# Patient Record
Sex: Male | Born: 1970
Health system: Southern US, Community
[De-identification: ages and names within clinical notes are randomized; demographics above are authoritative.]

## PROBLEM LIST (undated history)

## (undated) DIAGNOSIS — E119 Type 2 diabetes mellitus without complications: Secondary | ICD-10-CM

## (undated) DIAGNOSIS — E7800 Pure hypercholesterolemia, unspecified: Secondary | ICD-10-CM

## (undated) DIAGNOSIS — I10 Essential (primary) hypertension: Secondary | ICD-10-CM

## (undated) HISTORY — PX: BICEPS TENDON REPAIR: SHX566

---

## 1999-09-04 ENCOUNTER — Encounter: Payer: Self-pay | Admitting: Emergency Medicine

## 1999-09-04 ENCOUNTER — Emergency Department (HOSPITAL_COMMUNITY): Admission: EM | Admit: 1999-09-04 | Discharge: 1999-09-04 | Payer: Self-pay | Admitting: Emergency Medicine

## 2000-03-02 ENCOUNTER — Encounter: Payer: Self-pay | Admitting: Emergency Medicine

## 2000-03-02 ENCOUNTER — Emergency Department (HOSPITAL_COMMUNITY): Admission: EM | Admit: 2000-03-02 | Discharge: 2000-03-02 | Payer: Self-pay | Admitting: Emergency Medicine

## 2003-06-09 ENCOUNTER — Encounter: Payer: Self-pay | Admitting: Emergency Medicine

## 2003-06-09 ENCOUNTER — Emergency Department (HOSPITAL_COMMUNITY): Admission: EM | Admit: 2003-06-09 | Discharge: 2003-06-09 | Payer: Self-pay | Admitting: Emergency Medicine

## 2009-08-20 ENCOUNTER — Emergency Department (HOSPITAL_COMMUNITY): Admission: EM | Admit: 2009-08-20 | Discharge: 2009-08-20 | Payer: Self-pay | Admitting: Emergency Medicine

## 2010-04-06 ENCOUNTER — Emergency Department (HOSPITAL_COMMUNITY): Admission: EM | Admit: 2010-04-06 | Discharge: 2010-04-06 | Payer: Self-pay | Admitting: Family Medicine

## 2011-07-11 ENCOUNTER — Ambulatory Visit (INDEPENDENT_AMBULATORY_CARE_PROVIDER_SITE_OTHER): Payer: Self-pay

## 2011-07-11 ENCOUNTER — Inpatient Hospital Stay (INDEPENDENT_AMBULATORY_CARE_PROVIDER_SITE_OTHER)
Admission: RE | Admit: 2011-07-11 | Discharge: 2011-07-11 | Disposition: A | Discharge status: Home / Self Care | Payer: Self-pay | Source: Ambulatory Visit | Attending: Emergency Medicine | Admitting: Emergency Medicine

## 2011-07-11 DIAGNOSIS — M542 Cervicalgia: Secondary | ICD-10-CM

## 2011-07-11 DIAGNOSIS — S0993XA Unspecified injury of face, initial encounter: Secondary | ICD-10-CM

## 2011-07-11 DIAGNOSIS — S199XXA Unspecified injury of neck, initial encounter: Secondary | ICD-10-CM

## 2012-10-28 IMAGING — CR DG CERVICAL SPINE COMPLETE 4+V
6 series · 6 of 6 positions shown · non-contrast
Comparison: None.

CLINICAL DATA: Chronic neck pain, recently worsened.  Pain radiates
into the right shoulder and right upper extremity, with
paresthesias.

CERVICAL SPINE - COMPLETE 4+ VIEW 07/11/2011:

[view not recorded (1 of 6)]
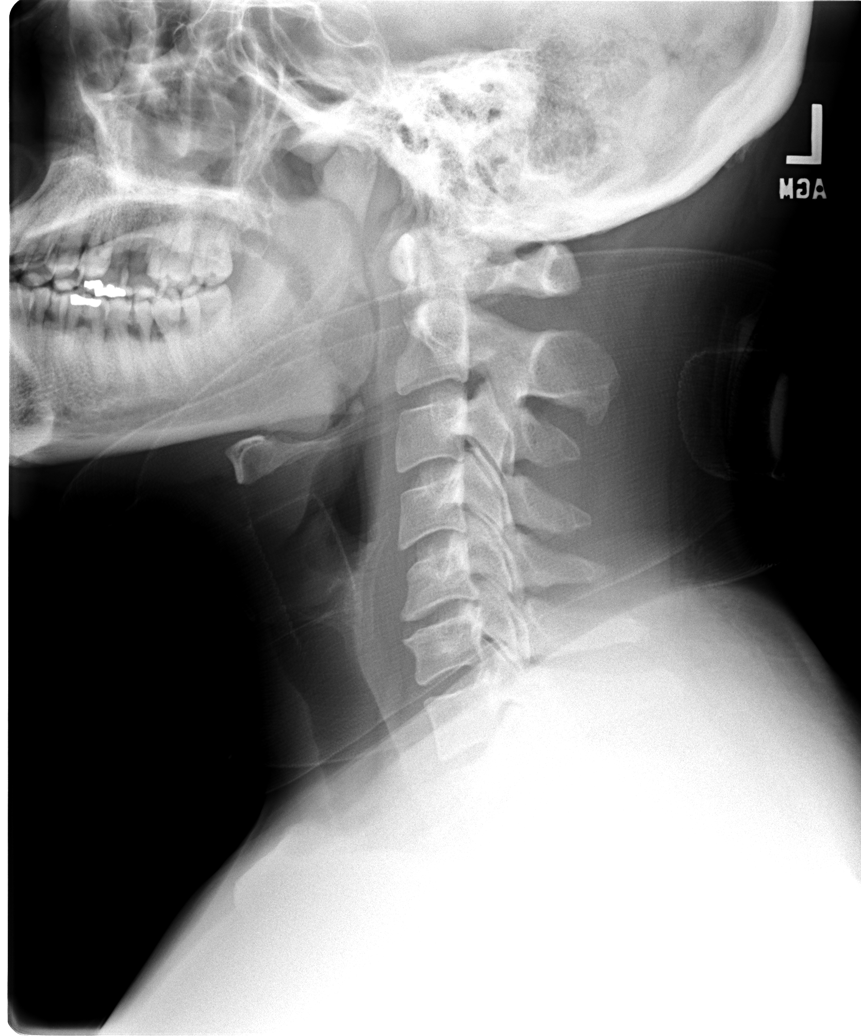

[view not recorded (2 of 6)]
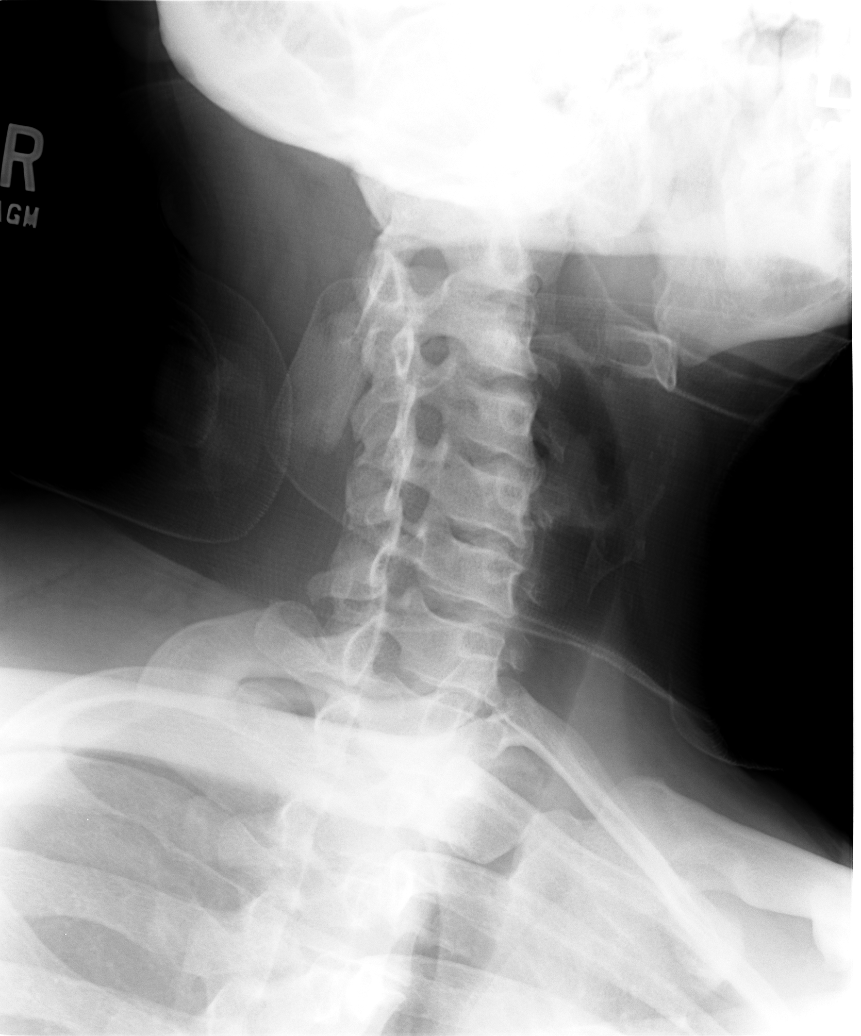

[view not recorded (3 of 6)]
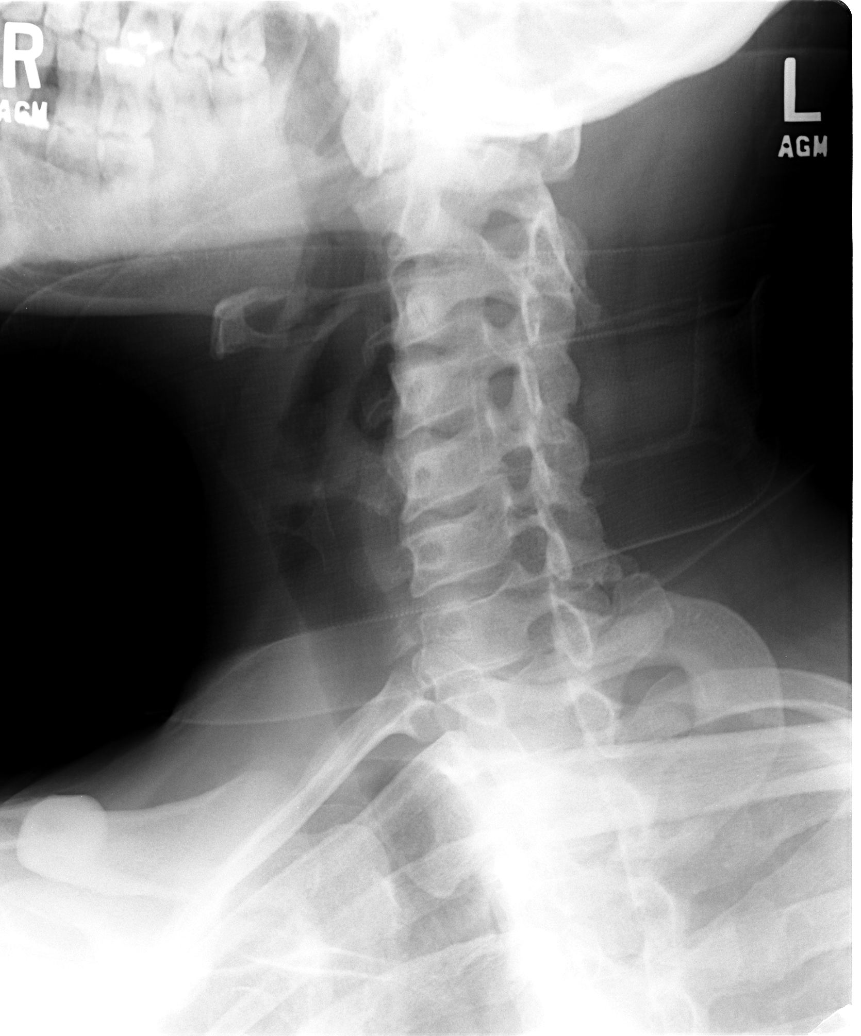

[view not recorded (4 of 6)]
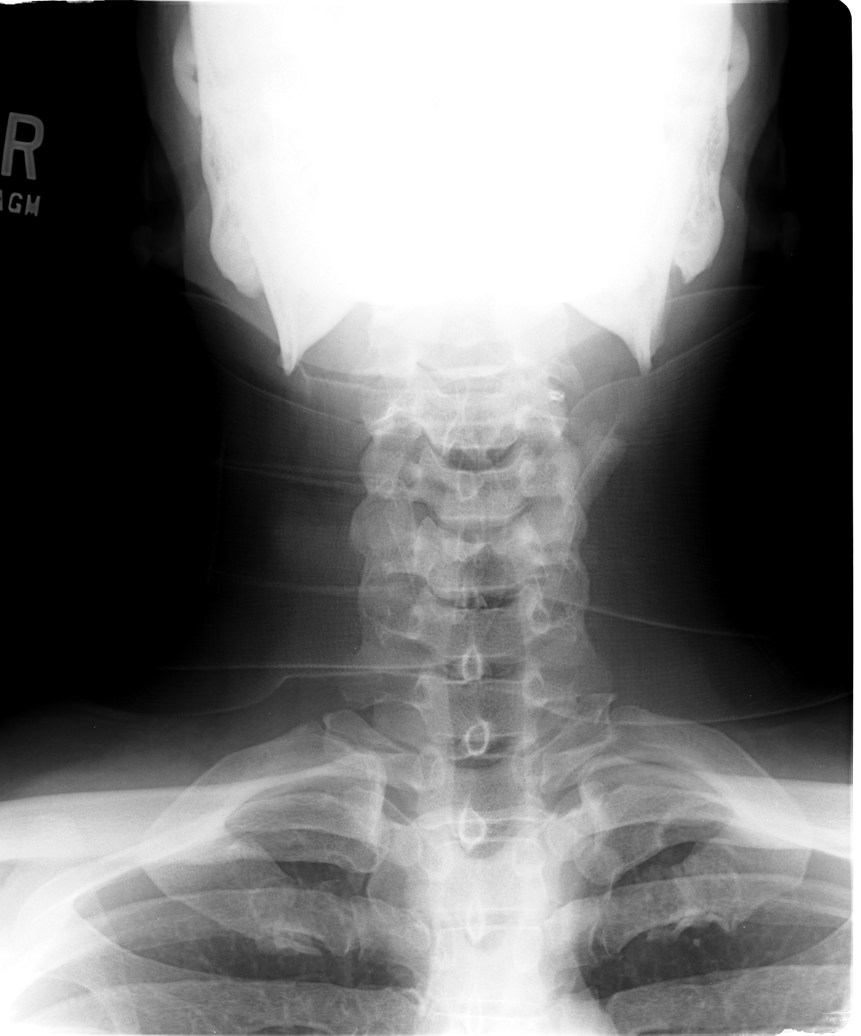

[view not recorded (5 of 6)]
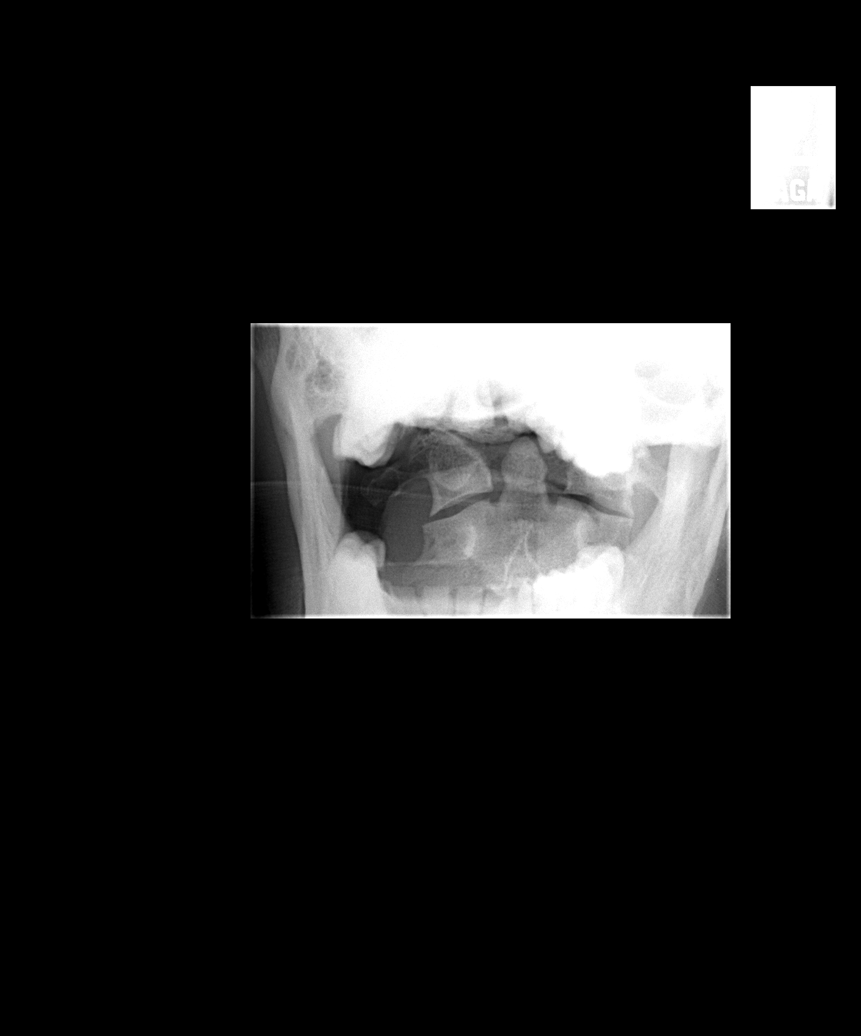

[view not recorded (6 of 6)]
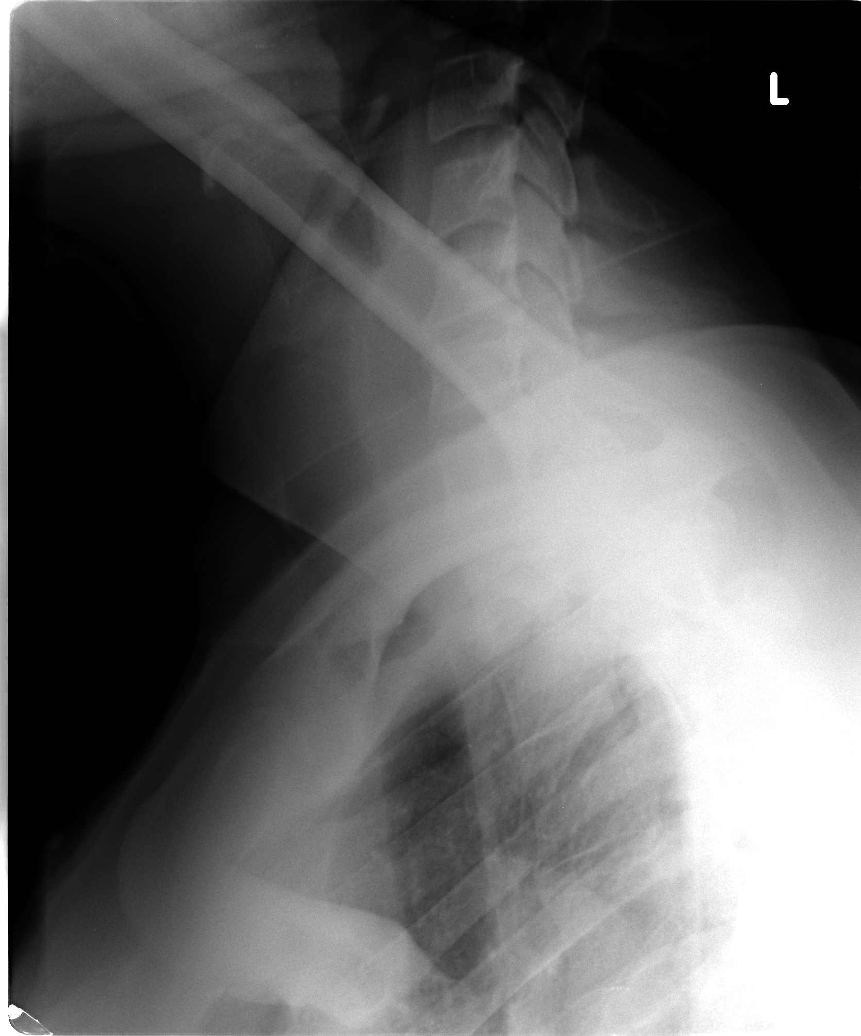

[6 of 6 positions shown; findings below may reference images not displayed]

FINDINGS: Examination was performed with the patient in a soft
cervical collar.  Slight straightening of the usual cervical
lordosis.  Anatomic posterior alignment.  No fractures.  Normal
prevertebral soft tissues.  Mild disc space narrowing and endplate
hypertrophic changes at C5-6.  Remaining disc spaces well
preserved.  No significant bony foraminal stenoses.  Facet joints
intact.  No static evidence of instability.
IMPRESSION: Slight straightening of the usual lordosis which may reflect
positioning and/or spasm.  Mild degenerative disc disease and
spondylosis at C5-6.

## 2021-02-06 ENCOUNTER — Other Ambulatory Visit: Payer: Self-pay

## 2021-02-06 ENCOUNTER — Encounter (HOSPITAL_COMMUNITY): Payer: Self-pay | Admitting: Emergency Medicine

## 2021-02-06 ENCOUNTER — Emergency Department (HOSPITAL_COMMUNITY)
Admission: EM | Admit: 2021-02-06 | Discharge: 2021-02-06 | Disposition: A | Discharge status: Home / Self Care | Payer: Self-pay | Attending: Emergency Medicine | Admitting: Emergency Medicine

## 2021-02-06 DIAGNOSIS — M5441 Lumbago with sciatica, right side: Secondary | ICD-10-CM

## 2021-02-06 DIAGNOSIS — M5440 Lumbago with sciatica, unspecified side: Secondary | ICD-10-CM | POA: Insufficient documentation

## 2021-02-06 DIAGNOSIS — M5442 Lumbago with sciatica, left side: Secondary | ICD-10-CM

## 2021-02-06 MED ORDER — METHOCARBAMOL 500 MG PO TABS: 500.0000 mg | ORAL_TABLET | Freq: Three times a day (TID) | ORAL | 0 refills | Status: DC | PRN

## 2021-02-06 MED ORDER — KETOROLAC TROMETHAMINE 60 MG/2ML IM SOLN
60.0000 mg | Freq: Once | INTRAMUSCULAR | Status: AC
  Administered 2021-02-06: 60 mg via INTRAMUSCULAR
  Filled 2021-02-06: qty 2

## 2021-02-06 MED ORDER — PREDNISONE 20 MG PO TABS: 60.0000 mg | ORAL_TABLET | Freq: Every day | ORAL | 0 refills | Status: AC

## 2021-02-06 MED ORDER — OXYCODONE-ACETAMINOPHEN 5-325 MG PO TABS
1.0000 | ORAL_TABLET | Freq: Once | ORAL | Status: AC
  Administered 2021-02-06: 1 via ORAL
  Filled 2021-02-06: qty 1

## 2021-02-06 MED ORDER — LIDOCAINE 5 % EX PTCH: 1.0000 | MEDICATED_PATCH | CUTANEOUS | 0 refills | Status: DC

## 2021-02-06 MED ORDER — METHOCARBAMOL 500 MG PO TABS
500.0000 mg | ORAL_TABLET | Freq: Once | ORAL | Status: AC
  Administered 2021-02-06: 500 mg via ORAL
  Filled 2021-02-06: qty 1

## 2021-02-06 NOTE — ED Triage Notes (Signed)
Pt states he injured his lower back yesterday when he was picking up backpack to get off bus.  Reports lower back pain that radiates down R leg.

## 2021-02-06 NOTE — Discharge Instructions (Signed)
You came to the emergency department today to be evaluated for your low back pain.  Your physical exam was reassuring.  Your symptoms are consistent with sciatica.  I have given you prescription for prednisone, lidocaine patch, and Robaxin.  Please use these medications as prescribed.  Follow-up with orthopedic for further management of your pain.  Today you received medications that may make you sleepy or impair your ability to make decisions.  For the next 24 hours please do not drive, operate heavy machinery, care for a small child with out another adult present, or perform any activities that may cause harm to you or someone else if you were to fall asleep or be impaired.   Today you were prescribed Methocarbamol (Robaxin).  Methocarbamol (Robaxin) is used to treat muscle spasms/pain.  It works by helping to relax the muscles.  Drowsiness, dizziness, lightheadedness, stomach upset, nausea/vomiting, or blurred vision may occur.  Do not drive, use machinery, or do anything that needs alertness or clear vision until you can do it safely.  Do not combine this medication with alcoholic beverages, marijuana, or other central nervous system depressants.     I have given you a prescription for steroids today.  Some common side effects include feelings of extra energy, feeling warm, increased appetite, and stomach upset.  If you are diabetic your sugars may run higher than usual.    Get help right away if: You are not able to control when you urinate or have bowel movements (incontinence). You have: Weakness in your lower back, pelvis, buttocks, or legs that gets worse. Redness or swelling of your back. A burning sensation when you urinate.

## 2021-02-06 NOTE — ED Provider Notes (Signed)
[name] EMERGENCY DEPARTMENT Provider Note   CSN: [id number] Arrival date & time: 02/06/21  1147     History Chief Complaint  Patient presents with  . Back Pain    Hunter Fisher is a 50 y.o. male reported history of sciatica.  Patient is with a chief complaint of bilateral low back pain.  Patient pain started yesterday after picking up a heavy bag.  Patient reports that pain has been constant, 10/10 on the pain scale, radiates into his legs.  It is worse when he is sitting and better when he is standing.    Reports that after picking up the bag yesterday he felt a sharp pain in his low back.  Patient reports that this pain is similar to pain he has had in the past related to his sciatica.  Patient reports that he recently moved from [place] and does not have any providers in the area.    Patient denies any numbness to extremities, weakness to extremities, saddle anesthesia, bowel or bladder dysfunction, fever, chills, neck pain.  Patient denies any traumatic falls or injuries.  Patient denies taking any blood thinners.  HPI     History reviewed. No pertinent past medical history.  There are no problems to display for this patient.   History reviewed. No pertinent surgical history.     No family history on file.  Social History   Tobacco Use  . Smoking status: Never Smoker  . Smokeless tobacco: Never Used  Substance Use Topics  . Alcohol use: Not Currently  . Drug use: Yes    Types: Marijuana    Home Medications Prior to Admission medications   Not on File    Allergies    Patient has no allergy information on record.  Review of Systems   Review of Systems  Constitutional: Negative for chills and fever.  Eyes: Negative for visual disturbance.  Respiratory: Negative for shortness of breath.   Cardiovascular: Negative for chest pain.  Gastrointestinal: Negative for nausea and vomiting.  Genitourinary: Negative for difficulty urinating and  dysuria.  Musculoskeletal: Positive for back pain. Negative for neck pain.  Skin: Negative for color change and rash.  Neurological: Negative for dizziness, syncope, weakness, light-headedness, numbness and headaches.  Psychiatric/Behavioral: Negative for confusion.    Physical Exam Updated Vital Signs BP 137/88 (BP Location: Left Arm)   Pulse 91   Temp 98.7 F (37.1 C)   Resp 16   SpO2 99%   Physical Exam Vitals and nursing note reviewed.  Constitutional:      General: He is not in acute distress.    Appearance: He is not ill-appearing, toxic-appearing or diaphoretic.  HENT:     Head: Normocephalic and atraumatic.  Eyes:     General: No scleral icterus.       Right eye: No discharge.        Left eye: No discharge.     Extraocular Movements: Extraocular movements intact.     Pupils: Pupils are equal, round, and reactive to light.  Cardiovascular:     Rate and Rhythm: Normal rate.  Pulmonary:     Effort: Pulmonary effort is normal.  Musculoskeletal:     Cervical back: Normal range of motion and neck supple. No deformity, rigidity, tenderness or bony tenderness.     Thoracic back: No deformity, tenderness or bony tenderness.     Lumbar back: Tenderness present. No deformity or bony tenderness.     Right lower leg: No deformity, tenderness  or bony tenderness. No edema.     Left lower leg: No deformity, tenderness or bony tenderness. No edema.     Comments: No erythema, bruising, warmth or swelling noted to patient's back  Skin:    General: Skin is warm and dry.  Neurological:     General: No focal deficit present.     Mental Status: He is alert.     GCS: GCS eye subscore is 4. GCS verbal subscore is 5. GCS motor subscore is 6.     Cranial Nerves: No cranial nerve deficit or facial asymmetry.     Sensory: Sensation is intact.     Motor: No weakness, tremor, seizure activity or pronator drift.     Coordination: Romberg sign negative. Finger-Nose-Finger Test normal.      Gait: Gait is intact.     Comments: CN II-XII intact, equal grip strength, +5 strength to bilateral upper and lower extremities   Antalgic gait  Psychiatric:        Behavior: Behavior is cooperative.     ED Results / Procedures / Treatments   Labs (all labs ordered are listed, but only abnormal results are displayed) Labs Reviewed - No data to display  EKG None  Radiology No results found.  Procedures Procedures   Medications Ordered in ED Medications  oxyCODONE-acetaminophen (PERCOCET/ROXICET) 5-325 MG per tablet 1 tablet (1 tablet Oral Given 02/06/21 1342)  ketorolac (TORADOL) injection 60 mg (60 mg Intramuscular Given 02/06/21 1343)  methocarbamol (ROBAXIN) tablet 500 mg (500 mg Oral Given 02/06/21 1343)    ED Course  I have reviewed the triage vital signs and the nursing notes.  Pertinent labs & imaging results that were available during my care of the patient were reviewed by me and considered in my medical decision making (see chart for details).    MDM Rules/Calculators/A&P                          Alert 50 year old male in no acute distress, uncomfortable appearing.  Patient is noted to be standing bent over with forearms resting on countertop.  Patient presents with chief complaint of low back pain radiating radiating into bilateral lower extremities.  Patient reports that his pain began yesterday after he picked up a heavy bag and felt a sharp pain in his lower back.  Patient reports history of sciatica and states pain similar to previous episodes.  Patient denies any weakness to extremities, numbness to extremities, saddle anesthesia, bowel or bladder dysfunction, neck pain.  Patient denies any falls or traumatic injuries.  Patient not on any blood thinners.  CN II through XII intact, +5 strength to bilateral upper and lower extremities, sensation intact, negative pronator drift, negative Romberg, negative abnormal nose to finger.  Patient was able to ambulate without  assistance was noted to have antalgic gait.  Tenderness to left and right lumbar back.  No tenderness to cervical, thoracic, or lumbar spine.  Patient's symptoms consistent with sciatica.  Lifting heavy bag without proper lifting technique likely causing muscle strain.  Patient was given Percocet, Toradol, and Robaxin.  Patient reports improvement in his symptoms.  Will prescribe patient Robaxin, lidocaine patch, and 5-day prednisone course.  Patient to follow-up with orthopedics.  Discussed results, findings, treatment and follow up. Patient advised of return precautions. Patient verbalized understanding and agreed with plan.   Final Clinical Impression(s) / ED Diagnoses Final diagnoses:  Acute bilateral low back pain with bilateral sciatica  Rx / DC Orders ED Discharge Orders         Ordered    predniSONE (DELTASONE) 20 MG tablet  Daily        02/06/21 1423    lidocaine (LIDODERM) 5 %  Every 24 hours        02/06/21 1423    methocarbamol (ROBAXIN) 500 MG tablet  Every 8 hours PRN        02/06/21 1423           Berneice Heinrich 02/06/21 2234    Derwood Kaplan, MD 02/07/21 [phone]

## 2021-02-06 NOTE — ED Notes (Signed)
Pt occupied on phone, will medicate once pt is available.

## 2021-02-13 ENCOUNTER — Encounter (HOSPITAL_COMMUNITY): Payer: Self-pay

## 2021-02-13 ENCOUNTER — Emergency Department (HOSPITAL_COMMUNITY)
Admission: EM | Admit: 2021-02-13 | Discharge: 2021-02-13 | Disposition: A | Discharge status: Home / Self Care | Payer: Self-pay | Attending: Emergency Medicine | Admitting: Emergency Medicine

## 2021-02-13 DIAGNOSIS — M5432 Sciatica, left side: Secondary | ICD-10-CM

## 2021-02-13 DIAGNOSIS — X501XXA Overexertion from prolonged static or awkward postures, initial encounter: Secondary | ICD-10-CM | POA: Insufficient documentation

## 2021-02-13 DIAGNOSIS — M7918 Myalgia, other site: Secondary | ICD-10-CM | POA: Insufficient documentation

## 2021-02-13 DIAGNOSIS — R202 Paresthesia of skin: Secondary | ICD-10-CM | POA: Insufficient documentation

## 2021-02-13 DIAGNOSIS — M5442 Lumbago with sciatica, left side: Secondary | ICD-10-CM | POA: Insufficient documentation

## 2021-02-13 MED ORDER — DEXAMETHASONE SODIUM PHOSPHATE 10 MG/ML IJ SOLN
10.0000 mg | Freq: Once | INTRAMUSCULAR | Status: AC
  Administered 2021-02-13: 10 mg via INTRAMUSCULAR
  Filled 2021-02-13: qty 1

## 2021-02-13 MED ORDER — METHOCARBAMOL 1000 MG/10ML IJ SOLN
1000.0000 mg | Freq: Once | INTRAMUSCULAR | Status: AC
  Administered 2021-02-13: 1000 mg via INTRAMUSCULAR
  Filled 2021-02-13: qty 10

## 2021-02-13 MED ORDER — METHOCARBAMOL 500 MG PO TABS: 500.0000 mg | ORAL_TABLET | Freq: Four times a day (QID) | ORAL | 0 refills | Status: DC | PRN

## 2021-02-13 NOTE — Discharge Instructions (Addendum)
1.  You were given a dose of Decadron in the emergency department.  This is a steroid shot.  It will be working and should be helpful for the next week.  You have been given a prescription for Robaxin.  This is a muscle relaxer.  You may take 1 tablet every 6 hours as needed for muscle spasms with pain. 2.  It is safe to take over-the-counter extra strength Tylenol every 6 hours along with your current medications.  This should be helpful for pain.  You may also use the Lidoderm patches that were previously prescribed. 3.  It is very important you get scheduled with a follow-up appointment to manage what is likely a chronic problem.  You should have a family physician.  A resource guide is included to help you find low-cost medical care.  Also you have been given information for [facility] [facility].  This is the clinic associated with the hospital.  Ultimately, you will likely need help getting physical therapy and referral to the spine specialist.

## 2021-02-13 NOTE — ED Triage Notes (Signed)
Pt reports that he hurt his back the other day at work, pain shoots down both legs, worse in the L leg, seen here the other day for the same and symptoms have not resolved.

## 2021-02-13 NOTE — ED Provider Notes (Signed)
[name] EMERGENCY DEPARTMENT Provider Note   CSN: [id number] Arrival date & time: 02/13/21  5397     History Chief Complaint  Patient presents with  . Back Pain    Hunter Fisher is a 50 y.o. male.  HPI Patient reports has had problems off and on with his lower back.  He has never had his surgery.  He did move to this area recently, within the past 3 weeks.  He reports he came from [place] and previously he was getting treated by a chiropractor intermittently.  He reports the chiropractor did some x-rays about 3 months ago.  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  He reports his lifting is not heavy but there is repetitive bending.  He was seen in the emergency department about a week ago and had symptoms of sciatica.  He was treated with a low-dose prednisone 20 mg daily and a Lidoderm patch and Robaxin.  He reports he was off work and was doing somewhat better.  He reports he went back to work today and with bending and lifting he started getting pain again and his employer wanted him to come and get rechecked.  Patient reports that the pain occurs in his very low back and buttocks.  It is greater in the left than the right.  He reports on the left to get some tingling and burning pain that goes down the back of the leg almost to the mid calf.  He reports on the right the pain will get down to behind his knee.  He reports it is very much exacerbated by bending forward.  He denies any feeling of weakness or numbness into the legs.  He reports he can walk without difficulty.  No difficulty urinating or having a bowel movement.  No associated abdominal pain.  Patient does not have any chronic medical problems that he is aware of.    History reviewed. No pertinent past medical history.  There are no problems to display for this patient.   History reviewed. No pertinent surgical history.     No family history on file.  Social History   Tobacco Use  .  Smoking status: Never Smoker  . Smokeless tobacco: Never Used  Substance Use Topics  . Alcohol use: Not Currently  . Drug use: Yes    Types: Marijuana    Home Medications Prior to Admission medications   Medication Sig Start Date End Date Taking? Authorizing Provider  methocarbamol (ROBAXIN) 500 MG tablet Take 1 tablet (500 mg total) by mouth every 6 (six) hours as needed for muscle spasms. 02/13/21  Yes Tinamarie Przybylski, Lebron Conners, MD  lidocaine (LIDODERM) 5 % Place 1 patch onto the skin daily. Remove & Discard patch within 12 hours or as directed by MD 02/06/21   Haskel Schroeder, PA-C  methocarbamol (ROBAXIN) 500 MG tablet Take 1 tablet (500 mg total) by mouth every 8 (eight) hours as needed for muscle spasms. 02/06/21   Haskel Schroeder, PA-C    Allergies    Patient has no known allergies.  Review of Systems   Review of Systems 10 systems reviewed and negative except as per HPI Physical Exam Updated Vital Signs BP (!) 159/109 (BP Location: Right Arm)   Pulse 96   Temp 98.4 F (36.9 C) (Oral)   Resp 18   SpO2 91%   Physical Exam Constitutional:      Appearance: Normal appearance.  HENT:     Mouth/Throat:  Pharynx: Oropharynx is clear.  Eyes:     Extraocular Movements: Extraocular movements intact.  Cardiovascular:     Rate and Rhythm: Normal rate.  Pulmonary:     Effort: Pulmonary effort is normal.     Breath sounds: Normal breath sounds.  Abdominal:     General: There is no distension.     Palpations: Abdomen is soft.  Musculoskeletal:        General: No swelling. Normal range of motion.     Comments: There is some reproducible tenderness to palpation of the sciatic nerve in the buttock.  Particularly in the left buttock.  No pain to palpation at the knee or calf.  Mild paraspinous muscle body tenderness of the lumbar spine.  No bony point tenderness of the lumbar spine.  Patient ambulates with a nonantalgic gait.  Neurological:     General: No focal deficit  present.     Mental Status: He is alert and oriented to person, place, and time.     Motor: No weakness.     Coordination: Coordination normal.     Gait: Gait normal.     Comments: Patient is easily ambulatory without difficulty.  Psychiatric:        Mood and Affect: Mood normal.     ED Results / Procedures / Treatments   Labs (all labs ordered are listed, but only abnormal results are displayed) Labs Reviewed - No data to display  EKG None  Radiology No results found.  Procedures Procedures   Medications Ordered in ED Medications  dexamethasone (DECADRON) injection 10 mg (has no administration in time range)  methocarbamol (ROBAXIN) injection 1,000 mg (has no administration in time range)    ED Course  I have reviewed the triage vital signs and the nursing notes.  Pertinent labs & imaging results that were available during my care of the patient were reviewed by me and considered in my medical decision making (see chart for details).    MDM Rules/Calculators/A&P                          Patient presents with persisting symptoms of sciatica.  This is been exacerbated again by going back to work and repetitive bending.  At this time will do a IM shot of Decadron and Robaxin in the emergency department.  Patient counseled to continue Robaxin, Tylenol and Lidoderm patches on outpatient basis.  Patient counseled that he will need follow-up with both a family doctor for basic medical management and referrals for physical therapy and orthopedics as needed.  Patient is also provided with contact information for neurosurgery spine specialist.  Patient counseled that the best management for his condition at this time will be getting established with outpatient providers for further diagnostic evaluation if needed and interventional treatment.  No emergent conditions present at this time.  Patient does not have any loss of sensation, no dysfunction of bowel or bladder, he is ambulatory  without difficulty. Final Clinical Impression(s) / ED Diagnoses Final diagnoses:  Sciatica of left side    Rx / DC Orders ED Discharge Orders         Ordered    methocarbamol (ROBAXIN) 500 MG tablet  Every 6 hours PRN        02/13/21 1009           Arby Barrette, MD 02/13/21 1020

## 2021-02-13 NOTE — ED Notes (Signed)
Pharmacy called for missing Robaxin dose.

## 2021-02-13 NOTE — ED Notes (Signed)
Patient verbalizes understanding of discharge instructions. Opportunity for questioning and answers were provided. Armband removed by staff, pt discharged from ED and ambulated to lobby to return home with family.  

## 2021-02-19 ENCOUNTER — Other Ambulatory Visit: Payer: Self-pay

## 2021-02-19 ENCOUNTER — Ambulatory Visit: Payer: Self-pay | Admitting: Nurse Practitioner

## 2021-02-28 ENCOUNTER — Other Ambulatory Visit: Payer: Self-pay | Admitting: Physician Assistant

## 2021-02-28 ENCOUNTER — Ambulatory Visit: Payer: Self-pay | Attending: Nurse Practitioner | Admitting: Physician Assistant

## 2021-02-28 ENCOUNTER — Other Ambulatory Visit: Payer: Self-pay

## 2021-02-28 DIAGNOSIS — Z09 Encounter for follow-up examination after completed treatment for conditions other than malignant neoplasm: Secondary | ICD-10-CM

## 2021-02-28 DIAGNOSIS — M5430 Sciatica, unspecified side: Secondary | ICD-10-CM

## 2021-02-28 MED ORDER — METHOCARBAMOL 500 MG PO TABS: 1000.0000 mg | ORAL_TABLET | Freq: Three times a day (TID) | ORAL | 0 refills | Status: DC | PRN

## 2021-02-28 MED ORDER — NAPROXEN 500 MG PO TABS: 500.0000 mg | ORAL_TABLET | Freq: Two times a day (BID) | ORAL | 1 refills | Status: DC

## 2021-02-28 NOTE — Progress Notes (Unsigned)
Patient ID: Hunter Fisher, male   DOB: 1971-12-03, 50 y.o.   MRN: [id number]   Virtual Visit via Telephone Note  I connected with Hunter Fisher on 02/28/21 at  3:50 PM EST by telephone and verified that I am speaking with the correct person using two identifiers.  Location: Patient: home Provider: [facility] office   I discussed the limitations, risks, security and privacy concerns of performing an evaluation and management service by telephone and the availability of in person appointments. I also discussed with the patient that there may be a patient responsible charge related to this service. The patient expressed understanding and agreed to proceed.   History of Present Illness: After being seen in the ED 02/13/2021.  Still having some pain.  He has not made f/up appt with [facility] yet and lost paper with their information on it.  Feels as tho he can't perform all of his job duties.   From ED A/P: Patient presents with persisting symptoms of sciatica.  This is been exacerbated again by going back to work and repetitive bending.  At this time will do a IM shot of Decadron and Robaxin in the emergency department.  Patient counseled to continue Robaxin, Tylenol and Lidoderm patches on outpatient basis.  Patient counseled that he will need follow-up with both a family doctor for basic medical management and referrals for physical therapy and orthopedics as needed.  Patient is also provided with contact information for neurosurgery spine specialist.  Patient counseled that the best management for his condition at this time will be getting established with outpatient providers for further diagnostic evaluation if needed and interventional treatment.  No emergent conditions present at this time.  Patient does not have any loss of sensation, no dysfunction of bowel or bladder, he is ambulatory without difficulty.    Observations/Objective:  NAD.  A&Ox3    Assessment and Plan:  1. Sciatica,  unspecified laterality Robaxin and naprosyn sent.  I gave him information(and he wrote it down and read it back to me) for [facility] as he was supposed to f/up there and has not made appt  2. Encounter for examination following treatment at hospital    Follow Up Instructions: Assign PCP in about 2-3 months/bloodwork   I discussed the assessment and treatment plan with the patient. The patient was provided an opportunity to ask questions and all were answered. The patient agreed with the plan and demonstrated an understanding of the instructions.   The patient was advised to call back or seek an in-person evaluation if the symptoms worsen or if the condition fails to improve as anticipated.  I provided 16 minutes of non-face-to-face time during this encounter.   Georgian Co, PA-C

## 2021-03-01 ENCOUNTER — Ambulatory Visit: Payer: Self-pay | Attending: Family Medicine

## 2021-03-01 ENCOUNTER — Other Ambulatory Visit: Payer: Self-pay | Admitting: Physician Assistant

## 2021-03-01 ENCOUNTER — Other Ambulatory Visit: Payer: Self-pay

## 2021-03-01 DIAGNOSIS — Z13220 Encounter for screening for lipoid disorders: Secondary | ICD-10-CM

## 2021-03-01 DIAGNOSIS — Z130 Encounter for screening for diseases of the blood and blood-forming organs and certain disorders involving the immune mechanism: Secondary | ICD-10-CM

## 2021-03-01 DIAGNOSIS — Z131 Encounter for screening for diabetes mellitus: Secondary | ICD-10-CM

## 2021-03-01 MED FILL — METHOCARBAMOL 500 MG TABS: 500 | 30 days supply | Qty: 90 | Fill #0

## 2021-03-01 MED FILL — NAPROXEN 500 MG TABLET: 500 | 30 days supply | Qty: 60 | Fill #0

## 2021-03-02 LAB — COMPREHENSIVE METABOLIC PANEL WITH GFR
ALT: 31 IU/L (ref 0–44)
AST: 19 IU/L (ref 0–40)
Albumin/Globulin Ratio: 1.6 (ref 1.2–2.2)
Albumin: 4.3 g/dL (ref 4.0–5.0)
Alkaline Phosphatase: 105 IU/L (ref 44–121)
BUN/Creatinine Ratio: 11 (ref 9–20)
BUN: 13 mg/dL (ref 6–24)
Bilirubin Total: 0.3 mg/dL (ref 0.0–1.2)
CO2: 25 mmol/L (ref 20–29)
Calcium: 9.4 mg/dL (ref 8.7–10.2)
Chloride: 105 mmol/L (ref 96–106)
Creatinine, Ser: 1.21 mg/dL (ref 0.76–1.27)
Globulin, Total: 2.7 g/dL (ref 1.5–4.5)
Glucose: 90 mg/dL (ref 65–99)
Potassium: 4.1 mmol/L (ref 3.5–5.2)
Sodium: 146 mmol/L — ABNORMAL HIGH (ref 134–144)
Total Protein: 7 g/dL (ref 6.0–8.5)
eGFR: 73 mL/min/1.73

## 2021-03-02 LAB — CBC WITH DIFFERENTIAL/PLATELET
Basophils Absolute: 0 x10E3/uL (ref 0.0–0.2)
Basos: 1 %
EOS (ABSOLUTE): 0.4 x10E3/uL (ref 0.0–0.4)
Eos: 6 %
Hematocrit: 39.7 % (ref 37.5–51.0)
Hemoglobin: 13.3 g/dL (ref 13.0–17.7)
Immature Grans (Abs): 0 x10E3/uL (ref 0.0–0.1)
Immature Granulocytes: 1 %
Lymphocytes Absolute: 2.1 x10E3/uL (ref 0.7–3.1)
Lymphs: 31 %
MCH: 30.2 pg (ref 26.6–33.0)
MCHC: 33.5 g/dL (ref 31.5–35.7)
MCV: 90 fL (ref 79–97)
Monocytes Absolute: 0.6 x10E3/uL (ref 0.1–0.9)
Monocytes: 9 %
Neutrophils Absolute: 3.6 x10E3/uL (ref 1.4–7.0)
Neutrophils: 52 %
Platelets: 218 x10E3/uL (ref 150–450)
RBC: 4.4 x10E6/uL (ref 4.14–5.80)
RDW: 12.4 % (ref 11.6–15.4)
WBC: 6.8 x10E3/uL (ref 3.4–10.8)

## 2021-03-02 LAB — LIPID PANEL
Chol/HDL Ratio: 4.6 ratio (ref 0.0–5.0)
Cholesterol, Total: 190 mg/dL (ref 100–199)
HDL: 41 mg/dL
LDL Chol Calc (NIH): 119 mg/dL — ABNORMAL HIGH (ref 0–99)
Triglycerides: 169 mg/dL — ABNORMAL HIGH (ref 0–149)
VLDL Cholesterol Cal: 30 mg/dL (ref 5–40)

## 2021-03-02 LAB — HEMOGLOBIN A1C
Est. average glucose Bld gHb Est-mCnc: 126 mg/dL
Hgb A1c MFr Bld: 6 % — ABNORMAL HIGH (ref 4.8–5.6)

## 2021-03-07 ENCOUNTER — Other Ambulatory Visit: Payer: Self-pay | Admitting: Physician Assistant

## 2021-03-07 DIAGNOSIS — R7303 Prediabetes: Secondary | ICD-10-CM

## 2021-03-07 DIAGNOSIS — E789 Disorder of lipoprotein metabolism, unspecified: Secondary | ICD-10-CM

## 2021-03-07 MED ORDER — ATORVASTATIN CALCIUM 20 MG PO TABS: 20.0000 mg | ORAL_TABLET | Freq: Every day | ORAL | 1 refills | Status: DC

## 2021-03-07 MED ORDER — METFORMIN HCL 500 MG PO TABS: 500.0000 mg | ORAL_TABLET | Freq: Every day | ORAL | 1 refills | Status: DC

## 2021-03-09 ENCOUNTER — Ambulatory Visit: Payer: Self-pay

## 2021-03-09 NOTE — Telephone Encounter (Signed)
See result notes. 

## 2021-04-04 ENCOUNTER — Ambulatory Visit: Payer: Self-pay | Admitting: Critical Care Medicine

## 2021-04-04 NOTE — Progress Notes (Deleted)
   Subjective:    Patient ID: Hunter Fisher, male    DOB: 23-Aug-1971, 50 y.o.   MRN: [id number]  50 y.o.Here to est PCP   PreDM  04/04/2021 Saw McClung 02/2021: 1. Sciatica, unspecified laterality Robaxin and naprosyn sent.  I gave him information(and he wrote it down and read it back to me) for [facility] as he was supposed to f/up there and has not made appt  2. Encounter for examination following treatment at hospital      Review of Systems     Objective:   Physical Exam        Assessment & Plan:

## 2021-06-07 ENCOUNTER — Ambulatory Visit: Payer: Self-pay | Admitting: Critical Care Medicine

## 2021-06-07 NOTE — Progress Notes (Deleted)
   New Patient Office Visit  Subjective:  Patient ID: Hunter Fisher, male    DOB: [birth year]  Age: 50 y.o. MRN: [id number]  CC: No chief complaint on file.   HPI Hunter Fisher presents for T2DM HLD here to est pcp   No past medical history on file.  No past surgical history on file.  No family history on file.  Social History   Socioeconomic History   Marital status: Single    Spouse name: Not on file   Number of children: Not on file   Years of education: Not on file   Highest education level: Not on file  Occupational History   Not on file  Tobacco Use   Smoking status: Never   Smokeless tobacco: Never  Substance and Sexual Activity   Alcohol use: Not Currently   Drug use: Yes    Types: Marijuana   Sexual activity: Not on file  Other Topics Concern   Not on file  Social History Narrative   Not on file   Social Determinants of Health   Financial Resource Strain: Not on file  Food Insecurity: Not on file  Transportation Needs: Not on file  Physical Activity: Not on file  Stress: Not on file  Social Connections: Not on file  Intimate Partner Violence: Not on file    ROS Review of Systems  Objective:   Today's Vitals: There were no vitals taken for this visit.  Physical Exam  Assessment & Plan:   Problem List Items Addressed This Visit   None   Outpatient Encounter Medications as of 06/07/2021  Medication Sig   atorvastatin (LIPITOR) 20 MG tablet Take 1 tablet (20 mg total) by mouth daily.   atorvastatin (LIPITOR) 20 MG tablet TAKE 1 TABLET (20 MG TOTAL) BY MOUTH DAILY.   lidocaine (LIDODERM) 5 % Place 1 patch onto the skin daily. Remove & Discard patch within 12 hours or as directed by MD   metFORMIN (GLUCOPHAGE) 500 MG tablet Take 1 tablet (500 mg total) by mouth daily with breakfast.   metFORMIN (GLUCOPHAGE) 500 MG tablet TAKE 1 TABLET (500 MG TOTAL) BY MOUTH DAILY WITH BREAKFAST.   methocarbamol (ROBAXIN) 500 MG tablet Take 1 tablet (500 mg  total) by mouth every 6 (six) hours as needed for muscle spasms.   methocarbamol (ROBAXIN) 500 MG tablet Take 2 tablets (1,000 mg total) by mouth every 8 (eight) hours as needed for muscle spasms.   methocarbamol (ROBAXIN) 500 MG tablet TAKE 2 TABLETS BY MOUTH EVERY 8 HOURS AS NEEDED FOR MUSCLE SPASMS.   naproxen (NAPROSYN) 500 MG tablet Take 1 tablet (500 mg total) by mouth 2 (two) times daily with a meal.   naproxen (NAPROSYN) 500 MG tablet TAKE 1 TABLET BY MOUTH 2 TIMES DAILY WITH A MEAL.   No facility-administered encounter medications on file as of 06/07/2021.    Follow-up: No follow-ups on file.   Shan Levans, MD

## 2022-08-03 ENCOUNTER — Ambulatory Visit (HOSPITAL_COMMUNITY): Payer: Self-pay

## 2022-08-04 ENCOUNTER — Ambulatory Visit (HOSPITAL_COMMUNITY)
Admission: RE | Admit: 2022-08-04 | Discharge: 2022-08-04 | Disposition: A | Discharge status: Home / Self Care | Payer: Self-pay | Source: Ambulatory Visit | Attending: Family Medicine | Admitting: Family Medicine

## 2022-08-04 ENCOUNTER — Encounter (HOSPITAL_COMMUNITY): Payer: Self-pay

## 2022-08-04 VITALS — BP 179/107 | HR 77 | Temp 100.0°F | Resp 18

## 2022-08-04 DIAGNOSIS — Z202 Contact with and (suspected) exposure to infections with a predominantly sexual mode of transmission: Secondary | ICD-10-CM

## 2022-08-04 HISTORY — DX: Type 2 diabetes mellitus without complications: E11.9

## 2022-08-04 HISTORY — DX: Essential (primary) hypertension: I10

## 2022-08-04 HISTORY — DX: Pure hypercholesterolemia, unspecified: E78.00

## 2022-08-04 LAB — HIV ANTIBODY (ROUTINE TESTING W REFLEX): HIV Screen 4th Generation wRfx: NONREACTIVE

## 2022-08-04 MED ORDER — METRONIDAZOLE 500 MG PO TABS: 2000.0000 mg | ORAL_TABLET | Freq: Once | ORAL | 0 refills | Status: AC

## 2022-08-04 NOTE — ED Provider Notes (Signed)
[facility]    CSN: [id number] Arrival date & time: 08/04/22  1303      History   Chief Complaint Chief Complaint  Patient presents with   appt 130    HPI Hunter Fisher is a 51 y.o. male.   HPI Here for exposure to trichomonas.  A sexual partner has noted to him that he should get treated for trichomonas.  He has no dysuria, penile discharge, or itching.  Past Medical History:  Diagnosis Date   Diabetes mellitus without complication (HCC)    High cholesterol    Hypertension     There are no problems to display for this patient.   History reviewed. No pertinent surgical history.     Home Medications    Prior to Admission medications   Medication Sig Start Date End Date Taking? Authorizing Provider  metroNIDAZOLE (FLAGYL) 500 MG tablet Take 4 tablets (2,000 mg total) by mouth once for 1 dose. 08/04/22 08/04/22 Yes Zenia Resides, MD    Family History No family history on file.  Social History Social History   Tobacco Use   Smoking status: Never   Smokeless tobacco: Never  Substance Use Topics   Alcohol use: Not Currently   Drug use: Yes    Types: Marijuana     Allergies   Patient has no known allergies.   Review of Systems Review of Systems   Physical Exam Triage Vital Signs ED Triage Vitals  Enc Vitals Group     BP 08/04/22 1340 (!) 179/107     Pulse Rate 08/04/22 1340 77     Resp 08/04/22 1340 18     Temp 08/04/22 1340 100 F (37.8 C)     Temp Source 08/04/22 1340 Oral     SpO2 08/04/22 1340 96 %     Weight --      Height --      Head Circumference --      Peak Flow --      Pain Score 08/04/22 1337 0     Pain Loc --      Pain Edu? --      Excl. in GC? --    No data found.  Updated Vital Signs BP (!) 179/107 (BP Location: Right Arm)   Pulse 77   Temp 100 F (37.8 C) (Oral)   Resp 18   SpO2 96%   Visual Acuity Right Eye Distance:   Left Eye Distance:   Bilateral Distance:    Right Eye Near:   Left Eye  Near:    Bilateral Near:     Physical Exam Vitals reviewed.  Constitutional:      General: He is not in acute distress.    Appearance: He is not ill-appearing, toxic-appearing or diaphoretic.  Cardiovascular:     Rate and Rhythm: Normal rate and regular rhythm.  Pulmonary:     Effort: Pulmonary effort is normal.     Breath sounds: Normal breath sounds.  Skin:    Coloration: Skin is not jaundiced or pale.  Neurological:     Mental Status: He is alert and oriented to person, place, and time.  Psychiatric:        Behavior: Behavior normal.      UC Treatments / Results  Labs (all labs ordered are listed, but only abnormal results are displayed) Labs Reviewed  HIV ANTIBODY (ROUTINE TESTING W REFLEX)  RPR  CYTOLOGY, (ORAL, ANAL, URETHRAL) ANCILLARY ONLY    EKG   Radiology No  results found.  Procedures Procedures (including critical care time)  Medications Ordered in UC Medications - No data to display  Initial Impression / Assessment and Plan / UC Course  I have reviewed the triage vital signs and the nursing notes.  Pertinent labs & imaging results that were available during my care of the patient were reviewed by me and considered in my medical decision making (see chart for details).     We will treat empirically for the trichomonas with 1 dose of metronidazole.  His last alcohol intake was about 48 hours ago.  We discussed no alcohol for the next 72 hours.  He elected to do HIV and RPR screening also  Staff will notify him and treat per protocol any positives Final Clinical Impressions(s) / UC Diagnoses   Final diagnoses:  Exposure to STD     Discharge Instructions      Take metronidazole 500 mg--4 tablets together 1 time.  This will be best taken after a good meal.  Do not drink alcohol for 72 hours after taking this medication.  Staff will notify you if there is anything positive on your swab or blood test     ED Prescriptions     Medication  Sig Dispense Auth. Provider   metroNIDAZOLE (FLAGYL) 500 MG tablet Take 4 tablets (2,000 mg total) by mouth once for 1 dose. 4 tablet Casimir Barcellos, Janace Aris, MD      PDMP not reviewed this encounter.   Zenia Resides, MD 08/04/22 1359

## 2022-08-04 NOTE — ED Triage Notes (Signed)
Pt reports that a sexual partner informed him that needed to be check for Trich. Denies discharge or discomfort.

## 2022-08-04 NOTE — Discharge Instructions (Addendum)
Take metronidazole 500 mg--4 tablets together 1 time.  This will be best taken after a good meal.  Do not drink alcohol for 72 hours after taking this medication.  Staff will notify you if there is anything positive on your swab or blood test

## 2022-08-05 LAB — CYTOLOGY, (ORAL, ANAL, URETHRAL) ANCILLARY ONLY
Chlamydia: NEGATIVE
Comment: NEGATIVE
Comment: NEGATIVE
Comment: NORMAL
Neisseria Gonorrhea: NEGATIVE
Trichomonas: POSITIVE — AB

## 2022-08-05 LAB — RPR: RPR Ser Ql: NONREACTIVE

## 2022-08-19 ENCOUNTER — Ambulatory Visit (HOSPITAL_COMMUNITY)
Admission: EM | Admit: 2022-08-19 | Discharge: 2022-08-19 | Disposition: A | Discharge status: Home / Self Care | Payer: Self-pay | Attending: Internal Medicine | Admitting: Internal Medicine

## 2022-08-19 ENCOUNTER — Encounter (HOSPITAL_COMMUNITY): Payer: Self-pay

## 2022-08-19 DIAGNOSIS — S39012A Strain of muscle, fascia and tendon of lower back, initial encounter: Secondary | ICD-10-CM

## 2022-08-19 DIAGNOSIS — M5442 Lumbago with sciatica, left side: Secondary | ICD-10-CM

## 2022-08-19 DIAGNOSIS — M62830 Muscle spasm of back: Secondary | ICD-10-CM

## 2022-08-19 MED ORDER — TIZANIDINE HCL 4 MG PO TABS: 4.0000 mg | ORAL_TABLET | Freq: Four times a day (QID) | ORAL | 0 refills | Status: DC | PRN

## 2022-08-19 MED ORDER — IBUPROFEN 800 MG PO TABS
800.0000 mg | ORAL_TABLET | Freq: Three times a day (TID) | ORAL | 0 refills | Status: DC
Start: 2022-08-19 — End: 2024-01-15

## 2022-08-19 NOTE — Discharge Instructions (Signed)
Advised take ibuprofen 800 mg every 8 hours with food to help decrease the pain and inflammation of the lower back. Advised take the Zanaflex every 6-8 hours as needed to help reduce the muscle spasm. Advised ice therapy, 10 minutes on 20 minutes off, 4-5 times throughout the day to help reduce pain and spasm. Advised follow-up PCP or return to urgent care if symptoms fail to improve.

## 2022-08-19 NOTE — ED Provider Notes (Signed)
[facility]    CSN: [id number] Arrival date & time: 08/19/22  3818      History   Chief Complaint Chief Complaint  Patient presents with   Back Pain    HPI Hunter Fisher is a 51 y.o. male.   51 year old male presents with lower back pain.  Patient indicates he has a history of having lower back pain and he used to see a chiropractor on a regular basis when he was living up [place].  Patient indicates yesterday he was carrying in some groceries and tried to unlock his door which was stuck ended up pushing it fairly hard when he felt his back twinge.  Patient indicates since then he has been having some lower back pain around L5-S1 area which is more concentrated on the left with radiation down the left leg to about the knee.  Patient indicates the pain is worse when he sits for longer than several minutes and improved when he stands up.  He indicates that the pain is also worse when he turns, twists or bends.  He indicates he has been taking some ibuprofen and Tylenol which has given him minimal relief.  Patient indicates that he is not having any urinary problems and is able to urinate without hesitancy or dysuria.  Patient denies numbness, weakness, of the lower extremities.  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  He has been cautioned about trying to lift with help over the next couple weeks.   Back Pain   Past Medical History:  Diagnosis Date   Diabetes mellitus without complication (HCC)    High cholesterol    Hypertension     There are no problems to display for this patient.   History reviewed. No pertinent surgical history.     Home Medications    Prior to Admission medications   Medication Sig Start Date End Date Taking? Authorizing Provider  ibuprofen (ADVIL) 800 MG tablet Take 1 tablet (800 mg total) by mouth 3 (three) times daily. 08/19/22  Yes Ellsworth Lennox, PA-C  tiZANidine (ZANAFLEX) 4 MG tablet  Take 1 tablet (4 mg total) by mouth every 6 (six) hours as needed for muscle spasms. 08/19/22  Yes Ellsworth Lennox, PA-C    Family History History reviewed. No pertinent family history.  Social History Social History   Tobacco Use   Smoking status: Never   Smokeless tobacco: Never  Substance Use Topics   Alcohol use: Not Currently   Drug use: Yes    Types: Marijuana     Allergies   Patient has no known allergies.   Review of Systems Review of Systems  Musculoskeletal:  Positive for back pain (lower back that is worse on the left).     Physical Exam Triage Vital Signs ED Triage Vitals  Enc Vitals Group     BP 08/19/22 0831 121/86     Pulse Rate 08/19/22 0831 82     Resp 08/19/22 0831 12     Temp 08/19/22 0831 98.9 F (37.2 C)     Temp Source 08/19/22 0831 Oral     SpO2 08/19/22 0831 98 %     Weight 08/19/22 0829 261 lb (118.4 kg)     Height 08/19/22 0829 5\' 11"  (1.803 m)     Head Circumference --      Peak Flow --      Pain Score 08/19/22 0829 8     Pain  Loc --      Pain Edu? --      Excl. in GC? --    No data found.  Updated Vital Signs BP 121/86 (BP Location: Left Arm)   Pulse 82   Temp 98.9 F (37.2 C) (Oral)   Resp 12   Ht 5\' 11"  (1.803 m)   Wt 261 lb (118.4 kg)   SpO2 98%   BMI 36.40 kg/m   Visual Acuity Right Eye Distance:   Left Eye Distance:   Bilateral Distance:    Right Eye Near:   Left Eye Near:    Bilateral Near:     Physical Exam Constitutional:      Appearance: Normal appearance.  Musculoskeletal:       Back:     Comments: Lower back: Pain is palpated along the L5/S1 area that is worse at the left paraspinous.  There is no redness or swelling.  Mildly positive straight leg raise is present bilaterally, strength is intact bilaterally, DTRs are 2+ and symmetrical bilaterally.  Pain is increased with sitting, turning, and bending.  Neurological:     Mental Status: He is alert.      UC Treatments / Results  Labs (all labs  ordered are listed, but only abnormal results are displayed) Labs Reviewed - No data to display  EKG   Radiology No results found.  Procedures Procedures (including critical care time)  Medications Ordered in UC Medications - No data to display  Initial Impression / Assessment and Plan / UC Course  I have reviewed the triage vital signs and the nursing notes.  Pertinent labs & imaging results that were available during my care of the patient were reviewed by me and considered in my medical decision making (see chart for details).    Plan: 1.  Advised ibuprofen 800 mg every 8 hours with food to help reduce the pain. 2.  Advised Zanaflex every 6-8 hours to help reduce the spasm. 3.  Advised ice therapy, 10 minutes on 20 minutes off, 4-5 times throughout the day to help reduce the spasm and discomfort. 4.  Advised to follow-up PCP or return to urgent care if symptoms fail to improve. Final Clinical Impressions(s) / UC Diagnoses   Final diagnoses:  Acute midline low back pain with left-sided sciatica  Strain of lumbar region, initial encounter  Muscle spasm of back     Discharge Instructions      Advised take ibuprofen 800 mg every 8 hours with food to help decrease the pain and inflammation of the lower back. Advised take the Zanaflex every 6-8 hours as needed to help reduce the muscle spasm. Advised ice therapy, 10 minutes on 20 minutes off, 4-5 times throughout the day to help reduce pain and spasm. Advised follow-up PCP or return to urgent care if symptoms fail to improve.    ED Prescriptions     Medication Sig Dispense Auth. Provider   ibuprofen (ADVIL) 800 MG tablet Take 1 tablet (800 mg total) by mouth 3 (three) times daily. 21 tablet , PA-C   tiZANidine (ZANAFLEX) 4 MG tablet Take 1 tablet (4 mg total) by mouth every 6 (six) hours as needed for muscle spasms. 30 tablet Ellsworth Lennox, PA-C      PDMP not reviewed this encounter.   Ellsworth Lennox,  PA-C 08/19/22 [phone]

## 2022-08-19 NOTE — ED Triage Notes (Signed)
Pt was lifting groceries last night and almost slip on the step . Pt woke up with back pain. Pt has a previus injury to the back .

## 2024-01-15 ENCOUNTER — Ambulatory Visit (HOSPITAL_COMMUNITY)
Admission: EM | Admit: 2024-01-15 | Discharge: 2024-01-15 | Disposition: A | Discharge status: Home / Self Care | Payer: 59 | Attending: Emergency Medicine | Admitting: Emergency Medicine

## 2024-01-15 ENCOUNTER — Encounter (HOSPITAL_COMMUNITY): Payer: Self-pay

## 2024-01-15 DIAGNOSIS — T148XXA Other injury of unspecified body region, initial encounter: Secondary | ICD-10-CM

## 2024-01-15 DIAGNOSIS — L089 Local infection of the skin and subcutaneous tissue, unspecified: Secondary | ICD-10-CM

## 2024-01-15 DIAGNOSIS — S31119A Laceration without foreign body of abdominal wall, unspecified quadrant without penetration into peritoneal cavity, initial encounter: Secondary | ICD-10-CM | POA: Diagnosis not present

## 2024-01-15 DIAGNOSIS — Z23 Encounter for immunization: Secondary | ICD-10-CM

## 2024-01-15 MED ORDER — DOXYCYCLINE HYCLATE 100 MG PO CAPS: 100.0000 mg | ORAL_CAPSULE | Freq: Two times a day (BID) | ORAL | 0 refills | Status: DC

## 2024-01-15 MED ORDER — TETANUS-DIPHTH-ACELL PERTUSSIS 5-2.5-18.5 LF-MCG/0.5 IM SUSY
0.5000 mL | PREFILLED_SYRINGE | Freq: Once | INTRAMUSCULAR | Status: AC
  Administered 2024-01-15: 0.5 mL via INTRAMUSCULAR

## 2024-01-15 MED ORDER — TETANUS-DIPHTH-ACELL PERTUSSIS 5-2.5-18.5 LF-MCG/0.5 IM SUSY
PREFILLED_SYRINGE | INTRAMUSCULAR | Status: AC
Start: 2024-01-15 — End: ?
  Filled 2024-01-15: qty 0.5

## 2024-01-15 NOTE — ED Provider Notes (Addendum)
[facility]    CSN: [id number] Arrival date & time: 01/15/24  1356      History   Chief Complaint Chief Complaint  Patient presents with   Laceration    HPI Hunter Fisher is a 53 y.o. male.   Patient presents with laceration to right lower abdomen that occurred 6 to 7 days ago.  Patient states he fell asleep with a knife on his work belt and rolled over onto the knife.  Patient denies fever.   Laceration Associated symptoms: no fever     Past Medical History:  Diagnosis Date   Diabetes mellitus without complication (HCC)    High cholesterol    Hypertension     There are no active problems to display for this patient.   Past Surgical History:  Procedure Laterality Date   BICEPS TENDON REPAIR Left        Home Medications    Prior to Admission medications   Medication Sig Start Date End Date Taking? Authorizing Provider  doxycycline (VIBRAMYCIN) 100 MG capsule Take 1 capsule (100 mg total) by mouth 2 (two) times daily. 01/15/24  Yes Letta Kocher, NP    Family History History reviewed. No pertinent family history.  Social History Social History   Tobacco Use   Smoking status: Some Days    Types: Cigarettes   Smokeless tobacco: Never  Vaping Use   Vaping status: Former  Substance Use Topics   Alcohol use: Yes    Comment: occasionally   Drug use: Yes    Types: Marijuana     Allergies   Patient has no known allergies.   Review of Systems Review of Systems  Constitutional:  Negative for chills and fever.  Skin:  Positive for color change and wound.     Physical Exam Triage Vital Signs ED Triage Vitals  Encounter Vitals Group     BP 01/15/24 1524 (!) 180/109     Systolic BP Percentile --      Diastolic BP Percentile --      Pulse Rate 01/15/24 1524 80     Resp 01/15/24 1524 16     Temp 01/15/24 1524 98 F (36.7 C)     Temp Source 01/15/24 1524 Oral     SpO2 01/15/24 1524 97 %     Weight 01/15/24 1525 265 lb (120.2  kg)     Height 01/15/24 1525 6' (1.829 m)     Head Circumference --      Peak Flow --      Pain Score 01/15/24 1527 6     Pain Loc --      Pain Education --      Exclude from Growth Chart --    No data found.  Updated Vital Signs BP (!) 180/109 (BP Location: Left Arm)   Pulse 80   Temp 98 F (36.7 C) (Oral)   Resp 16   Ht 6' (1.829 m)   Wt 265 lb (120.2 kg)   SpO2 97%   BMI 35.94 kg/m   Visual Acuity Right Eye Distance:   Left Eye Distance:   Bilateral Distance:    Right Eye Near:   Left Eye Near:    Bilateral Near:     Physical Exam Vitals and nursing note reviewed.  Constitutional:      General: He is awake. He is not in acute distress.    Appearance: Normal appearance. He is well-developed and well-groomed. He is not ill-appearing.  Skin:  General: Skin is warm and dry.     Findings: Erythema, laceration and wound present.          Comments: Open 1 inch wound with small amount of purulent drainage and surrounding erythema and swelling.  Neurological:     Mental Status: He is alert and oriented to person, place, and time. Mental status is at baseline.     GCS: GCS eye subscore is 4. GCS verbal subscore is 5. GCS motor subscore is 6.     Cranial Nerves: Cranial nerves 2-12 are intact.     Sensory: Sensation is intact.     Motor: Motor function is intact.     Coordination: Coordination is intact.     Gait: Gait is intact.  Psychiatric:        Behavior: Behavior is cooperative.      UC Treatments / Results  Labs (all labs ordered are listed, but only abnormal results are displayed) Labs Reviewed - No data to display  EKG   Radiology No results found.  Procedures Procedures (including critical care time)  Medications Ordered in UC Medications  Tdap (BOOSTRIX) injection 0.5 mL (has no administration in time range)    Initial Impression / Assessment and Plan / UC Course  I have reviewed the triage vital signs and the nursing  notes.  Pertinent labs & imaging results that were available during my care of the patient were reviewed by me and considered in my medical decision making (see chart for details).     Patient presented with laceration to right lower abdomen that occurred 6 to 7 days ago.  Patient unsure of last tetanus shot.  Patient has an open 1 inch wound with a small amount of purulent drainage. There is surrounding erythema and swelling.  Area surrounding redness indurated at this time.  Patient's blood pressure was 180/109 in clinic today.  Denies dizziness, headache, chest pain, shortness of breath, weakness, numbness, and confusion.  No neurodeficits noted on exam.  GCS 15.  Patient reports history of hypertension and used to take blood pressure medication, but stopped taking it.  Given tetanus shot in clinic. Prescribed doxycycline and recommend that he return for wound recheck in a few days.  Discussed using warm compresses to help with drainage.  Discussed applying antibiotic ointment to wound.  Recommended following up with primary care provider regarding blood pressure.  Clinical staff set up up appointment with primary care provider while patient was in clinic today. Final Clinical Impressions(s) / UC Diagnoses   Final diagnoses:  Laceration of abdominal wall, initial encounter  Wound infection     Discharge Instructions      Start taking doxycycline twice daily for 10 days.  Return here in about 3 to 4 days for recheck of wound.  Return sooner if you notice spreading of redness or worsening swelling or pain, or you develop a fever.  Follow-up with primary care provider regarding blood pressure management.    ED Prescriptions     Medication Sig Dispense Auth. Provider   doxycycline (VIBRAMYCIN) 100 MG capsule Take 1 capsule (100 mg total) by mouth 2 (two) times daily. 20 capsule Wynonia Lawman A, NP      PDMP not reviewed this encounter.   Letta Kocher, NP 01/15/24  1606    Wynonia Lawman A, NP 01/15/24 [phone]

## 2024-01-15 NOTE — Discharge Instructions (Addendum)
Start taking doxycycline twice daily for 10 days.  Return here in about 3 to 4 days for recheck of wound.  Return sooner if you notice spreading of redness or worsening swelling or pain, or you develop a fever.  Follow-up with primary care provider regarding blood pressure management.

## 2024-01-15 NOTE — ED Triage Notes (Signed)
Patient here today with c/o a laceration to right side lower abd 6-7 days ago with a knife and it is still bleeding some. Patient states that he had fallen asleep with a work knife on his belt and rolled over on it. He has been trying to keep it clean and putting ointment on it. He thought it would have scabbed up by now but it isn't. He is not up to date  on his tetanus.

## 2024-02-05 ENCOUNTER — Ambulatory Visit: Payer: Self-pay | Admitting: Family Medicine

## 2024-04-15 ENCOUNTER — Encounter (HOSPITAL_COMMUNITY): Payer: Self-pay

## 2024-04-15 ENCOUNTER — Ambulatory Visit (INDEPENDENT_AMBULATORY_CARE_PROVIDER_SITE_OTHER)

## 2024-04-15 ENCOUNTER — Ambulatory Visit (HOSPITAL_COMMUNITY)
Admission: RE | Admit: 2024-04-15 | Discharge: 2024-04-15 | Disposition: A | Discharge status: Home / Self Care | Source: Ambulatory Visit | Attending: Internal Medicine | Admitting: Internal Medicine

## 2024-04-15 ENCOUNTER — Other Ambulatory Visit: Payer: Self-pay

## 2024-04-15 VITALS — BP 166/91 | HR 77 | Temp 98.5°F | Resp 18

## 2024-04-15 DIAGNOSIS — M79644 Pain in right finger(s): Secondary | ICD-10-CM | POA: Insufficient documentation

## 2024-04-15 DIAGNOSIS — I10 Essential (primary) hypertension: Secondary | ICD-10-CM | POA: Diagnosis not present

## 2024-04-15 DIAGNOSIS — M7989 Other specified soft tissue disorders: Secondary | ICD-10-CM | POA: Diagnosis not present

## 2024-04-15 LAB — BASIC METABOLIC PANEL WITH GFR
Anion gap: 10 (ref 5–15)
BUN: 17 mg/dL (ref 6–20)
CO2: 28 mmol/L (ref 22–32)
Calcium: 9.7 mg/dL (ref 8.9–10.3)
Chloride: 105 mmol/L (ref 98–111)
Creatinine, Ser: 1.19 mg/dL (ref 0.61–1.24)
GFR, Estimated: >60 mL/min (ref >60)
Glucose, Bld: 92 mg/dL (ref 70–99)
Potassium: 3.5 mmol/L (ref 3.5–5.1)
Sodium: 143 mmol/L (ref 135–145)

## 2024-04-15 MED ORDER — PREDNISONE 20 MG PO TABS: 40.0000 mg | ORAL_TABLET | Freq: Every day | ORAL | 0 refills | Status: AC

## 2024-04-15 MED ORDER — AMLODIPINE BESYLATE 5 MG PO TABS: 5.0000 mg | ORAL_TABLET | Freq: Every day | ORAL | 1 refills | Status: AC

## 2024-04-15 NOTE — Discharge Instructions (Addendum)
 X-ray done of the right thumb today.  Final evaluation by the radiologist is still pending but there does not appear to be any acute fracture.  Once the radiologist has read the x-ray if there is any changes in the treatment plan we will contact you.  The pain and swelling could be associated with gout or an acute inflammation in the thumb. Also with untreated hypertension. We will treat with the following:  Prednisone  40 mg (2 tablets) once daily for 5 days. Take this in the morning.  This is a steroid to help with inflammation and pain. If the symptoms do not resolve, then may need to be started on medication for gout.  For the hypertension, BMP done today to ensure kidney function is good and will start amlodipine  5 mg daily. Make a follow-up appointment with PCP for long-term management of your hypertension. Return to urgent care or PCP if symptoms worsen or fail to resolve.

## 2024-04-15 NOTE — ED Provider Notes (Signed)
 [facility]    CSN: [id number] Arrival date & time: 04/15/24  1707      History   Chief Complaint Chief Complaint  Patient presents with   Appointment    1900   Finger Injury    HPI Hunter Fisher is a 53 y.o. male.   53 year old male who presents urgent care with complaints of right thumb pain and swelling.  Started about 2 or 3 days ago.  He denies any injury but wonders if he may have slept on it wrong.  He is having difficulty moving the thumb or making a fist.  He cannot grip anything with that hand.  He is left-handed.  He denies any history of gout.  He has never had a joint swell up or become painful like this.  He also has high blood pressure but has lost his primary care physician and is not taking any medication for it.  He has a history of high blood pressure and was prescribed medication in the past but did not want to take them due to concerns of side effects.     Past Medical History:  Diagnosis Date   Diabetes mellitus without complication (HCC)    High cholesterol    Hypertension     There are no active problems to display for this patient.   Past Surgical History:  Procedure Laterality Date   BICEPS TENDON REPAIR Left        Home Medications    Prior to Admission medications   Medication Sig Start Date End Date Taking? Authorizing Provider  doxycycline  (VIBRAMYCIN ) 100 MG capsule Take 1 capsule (100 mg total) by mouth 2 (two) times daily. 01/15/24   Karon Packer, NP    Family History History reviewed. No pertinent family history.  Social History Social History   Tobacco Use   Smoking status: Some Days    Types: Cigarettes   Smokeless tobacco: Never  Vaping Use   Vaping status: Former  Substance Use Topics   Alcohol use: Yes    Comment: occasionally   Drug use: Yes    Types: Marijuana     Allergies   Patient has no known allergies.   Review of Systems Review of Systems  Constitutional:  Negative for chills  and fever.  HENT:  Negative for ear pain and sore throat.   Eyes:  Negative for pain and visual disturbance.  Respiratory:  Negative for cough and shortness of breath.   Cardiovascular:  Negative for chest pain and palpitations.  Gastrointestinal:  Negative for abdominal pain and vomiting.  Genitourinary:  Negative for dysuria and hematuria.  Musculoskeletal:  Positive for joint swelling (right thumb). Negative for arthralgias and back pain.  Skin:  Negative for color change and rash.  Neurological:  Negative for seizures and syncope.  All other systems reviewed and are negative.    Physical Exam Triage Vital Signs ED Triage Vitals  Encounter Vitals Group     BP 04/15/24 1743 (!) 166/91     Systolic BP Percentile --      Diastolic BP Percentile --      Pulse Rate 04/15/24 1743 77     Resp 04/15/24 1743 18     Temp 04/15/24 1743 98.5 F (36.9 C)     Temp Source 04/15/24 1743 Oral     SpO2 04/15/24 1743 96 %     Weight --      Height --      Head Circumference --  Peak Flow --      Pain Score 04/15/24 1740 10     Pain Loc --      Pain Education --      Exclude from Growth Chart --    No data found.  Updated Vital Signs BP (!) 166/91 (BP Location: Right Arm) Comment (BP Location): large cuff  Pulse 77   Temp 98.5 F (36.9 C) (Oral)   Resp 18   SpO2 96%   Visual Acuity Right Eye Distance:   Left Eye Distance:   Bilateral Distance:    Right Eye Near:   Left Eye Near:    Bilateral Near:     Physical Exam Vitals and nursing note reviewed.  Constitutional:      General: He is not in acute distress.    Appearance: He is well-developed.  HENT:     Head: Normocephalic and atraumatic.  Eyes:     Conjunctiva/sclera: Conjunctivae normal.  Cardiovascular:     Rate and Rhythm: Normal rate and regular rhythm.     Heart sounds: No murmur heard. Pulmonary:     Effort: Pulmonary effort is normal. No respiratory distress.     Breath sounds: Normal breath sounds.   Abdominal:     Palpations: Abdomen is soft.     Tenderness: There is no abdominal tenderness.  Musculoskeletal:        General: No swelling.     Right hand: Swelling (right thumb) and tenderness (right thumb) present. Decreased range of motion (right thumb).     Cervical back: Neck supple.  Skin:    General: Skin is warm and dry.     Capillary Refill: Capillary refill takes less than 2 seconds.  Neurological:     Mental Status: He is alert.  Psychiatric:        Mood and Affect: Mood normal.     UC Treatments / Results  Labs (all labs ordered are listed, but only abnormal results are displayed) Labs Reviewed  BASIC METABOLIC PANEL WITH GFR    EKG   Radiology No results found.  Procedures Procedures (including critical care time)  Medications Ordered in UC Medications - No data to display  Initial Impression / Assessment and Plan / UC Course  I have reviewed the triage vital signs and the nursing notes.  Pertinent labs & imaging results that were available during my care of the patient were reviewed by me and considered in my medical decision making (see chart for details).     Pain of right thumb - Plan: DG Finger Thumb Right, DG Finger Thumb Right  Essential hypertension   X-ray done of the right thumb today.  Final evaluation by the radiologist is still pending but there does not appear to be any acute fracture.  Once the radiologist has read the x-ray if there is any changes in the treatment plan we will contact you.  The pain and swelling could be associated with gout or an acute inflammation in the thumb. Also with untreated hypertension. We will treat with the following:  Prednisone  40 mg (2 tablets) once daily for 5 days. Take this in the morning.  This is a steroid to help with inflammation and pain. If the symptoms do not resolve, then may need to be started on medication for gout.  For the hypertension, BMP done today to ensure kidney function is good and  will start amlodipine  5 mg daily. Make a follow-up appointment with PCP for long-term management of your hypertension. Return  to urgent care or PCP if symptoms worsen or fail to resolve.    Final Clinical Impressions(s) / UC Diagnoses   Final diagnoses:  Pain of right thumb  Essential hypertension   Discharge Instructions   None    ED Prescriptions   None    PDMP not reviewed this encounter.   Kreg Pesa, [place] 04/15/24 1920

## 2024-04-15 NOTE — ED Triage Notes (Signed)
 Patient noticed pain in right thumb a few days ago.  No known injury.  Now is painful and swollen.  Patient is left handed.

## 2024-08-14 ENCOUNTER — Emergency Department (HOSPITAL_COMMUNITY)
Admission: EM | Admit: 2024-08-14 | Discharge: 2024-08-15 | Disposition: A | Discharge status: Home / Self Care | Attending: Emergency Medicine | Admitting: Emergency Medicine

## 2024-08-14 ENCOUNTER — Emergency Department (HOSPITAL_COMMUNITY)

## 2024-08-14 ENCOUNTER — Encounter (HOSPITAL_COMMUNITY): Payer: Self-pay

## 2024-08-14 ENCOUNTER — Other Ambulatory Visit: Payer: Self-pay

## 2024-08-14 DIAGNOSIS — S82402A Unspecified fracture of shaft of left fibula, initial encounter for closed fracture: Secondary | ICD-10-CM

## 2024-08-14 DIAGNOSIS — S99912A Unspecified injury of left ankle, initial encounter: Secondary | ICD-10-CM | POA: Diagnosis present

## 2024-08-14 DIAGNOSIS — M4802 Spinal stenosis, cervical region: Secondary | ICD-10-CM | POA: Diagnosis not present

## 2024-08-14 DIAGNOSIS — S9305XA Dislocation of left ankle joint, initial encounter: Secondary | ICD-10-CM | POA: Diagnosis not present

## 2024-08-14 DIAGNOSIS — S82892A Other fracture of left lower leg, initial encounter for closed fracture: Secondary | ICD-10-CM

## 2024-08-14 DIAGNOSIS — S8262XA Displaced fracture of lateral malleolus of left fibula, initial encounter for closed fracture: Secondary | ICD-10-CM | POA: Diagnosis not present

## 2024-08-14 DIAGNOSIS — M7989 Other specified soft tissue disorders: Secondary | ICD-10-CM | POA: Insufficient documentation

## 2024-08-14 DIAGNOSIS — W108XXA Fall (on) (from) other stairs and steps, initial encounter: Secondary | ICD-10-CM | POA: Diagnosis not present

## 2024-08-14 DIAGNOSIS — S82832A Other fracture of upper and lower end of left fibula, initial encounter for closed fracture: Secondary | ICD-10-CM | POA: Diagnosis not present

## 2024-08-14 DIAGNOSIS — R590 Localized enlarged lymph nodes: Secondary | ICD-10-CM | POA: Diagnosis not present

## 2024-08-14 DIAGNOSIS — S82842A Displaced bimalleolar fracture of left lower leg, initial encounter for closed fracture: Secondary | ICD-10-CM | POA: Insufficient documentation

## 2024-08-14 DIAGNOSIS — S0990XA Unspecified injury of head, initial encounter: Secondary | ICD-10-CM | POA: Diagnosis not present

## 2024-08-14 DIAGNOSIS — S199XXA Unspecified injury of neck, initial encounter: Secondary | ICD-10-CM | POA: Diagnosis not present

## 2024-08-14 DIAGNOSIS — S8252XA Displaced fracture of medial malleolus of left tibia, initial encounter for closed fracture: Secondary | ICD-10-CM | POA: Diagnosis not present

## 2024-08-14 DIAGNOSIS — I10 Essential (primary) hypertension: Secondary | ICD-10-CM | POA: Diagnosis not present

## 2024-08-14 DIAGNOSIS — S82432A Displaced oblique fracture of shaft of left fibula, initial encounter for closed fracture: Secondary | ICD-10-CM | POA: Diagnosis not present

## 2024-08-14 MED ORDER — OXYCODONE-ACETAMINOPHEN 5-325 MG PO TABS: 1.0000 | ORAL_TABLET | Freq: Four times a day (QID) | ORAL | 0 refills | Status: DC | PRN

## 2024-08-14 MED ORDER — PROPOFOL 10 MG/ML IV BOLUS
0.5000 mg/kg | Freq: Once | INTRAVENOUS | Status: AC
  Administered 2024-08-13: 56.7 mg via INTRAVENOUS
  Filled 2024-08-14: qty 20

## 2024-08-14 MED ORDER — OXYCODONE-ACETAMINOPHEN 5-325 MG PO TABS
1.0000 | ORAL_TABLET | Freq: Once | ORAL | Status: AC
  Administered 2024-08-15: 1 via ORAL
  Filled 2024-08-14: qty 1

## 2024-08-14 MED ORDER — HYDROMORPHONE HCL 1 MG/ML IJ SOLN
1.0000 mg | Freq: Once | INTRAMUSCULAR | Status: AC
  Administered 2024-08-14: 1 mg via INTRAVENOUS
  Filled 2024-08-14: qty 1

## 2024-08-14 MED ORDER — SODIUM CHLORIDE 0.9 % IV BOLUS
500.0000 mL | Freq: Once | INTRAVENOUS | Status: AC
  Administered 2024-08-14: 500 mL via INTRAVENOUS

## 2024-08-14 MED ORDER — PROPOFOL 10 MG/ML IV BOLUS
INTRAVENOUS | Status: AC | PRN
  Administered 2024-08-14: 60 mg via INTRAVENOUS
  Administered 2024-08-14: 10 mg via INTRAVENOUS
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
## 2024-08-14 NOTE — Progress Notes (Signed)
 Orthopedic Tech Progress Note Patient Details:  Hunter Fisher 10-27-1971 [id number]  Ortho Devices Type of Ortho Device: Post (short leg) splint, Stirrup splint Ortho Device/Splint Location: lle Ortho Device/Splint Interventions: Ordered, Application, Adjustment  I applied splint post reduction with the drs assist. Dr molded the splint. Post Interventions Patient Tolerated: Well Instructions Provided: Care of device, Adjustment of device  Chandra Dorn PARAS 08/14/2024, 10:17 PM
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
## 2024-08-14 NOTE — ED Notes (Signed)
 Patient transported to CT

## 2024-08-14 NOTE — Discharge Instructions (Addendum)
 Your broken ankle was adjusted and placed in a splint. Please keep the splint on and use crutches to move around. Please do not bear any weight on your left foot. You may take pain medication as needed.   Please call [phone] to see the Orthopedic doctor (Dr Lonni Pae) on Monday 08/16/24.

## 2024-08-15 DIAGNOSIS — Z7401 Bed confinement status: Secondary | ICD-10-CM | POA: Diagnosis not present

## 2024-08-15 DIAGNOSIS — I10 Essential (primary) hypertension: Secondary | ICD-10-CM | POA: Diagnosis not present

## 2024-08-15 NOTE — Progress Notes (Signed)
 Orthopedic Tech Progress Note Patient Details:  Hunter Fisher June 23, 1971 [id number]  Patient ID: Hunter Fisher, male   DOB: 03-26-1971, 53 y.o.   MRN: [id number] Ed staff did crutches. Chandra Dorn PARAS 08/15/2024, 3:03 AM

## 2024-08-15 NOTE — ED Notes (Signed)
[facility] called for transport  to address on file.

## 2024-08-16 NOTE — ED Provider Notes (Signed)
 .Sedation  Date/Time: 08/16/2024 12:22 AM  Performed by: Doretha Folks, MD Authorized by: Doretha Folks, MD   Consent:    Consent obtained:  Verbal   Consent given by:  Patient   Risks discussed:  Allergic reaction, dysrhythmia, inadequate sedation, nausea, prolonged hypoxia resulting in organ damage, prolonged sedation necessitating reversal, respiratory compromise necessitating ventilatory assistance and intubation and vomiting   Alternatives discussed:  Analgesia without sedation, anxiolysis and regional anesthesia Universal protocol:    Procedure explained and questions answered to patient or proxy's satisfaction: yes     Relevant documents present and verified: yes     Test results available: yes     Imaging studies available: yes     Required blood products, implants, devices, and special equipment available: yes     Site/side marked: yes     Immediately prior to procedure, a time out was called: yes     Patient identity confirmed:  Verbally with patient Indications:    Procedure necessitating sedation performed by:  Physician performing sedation Pre-sedation assessment:    Time since last food or drink:  2 hours   ASA classification: class 1 - normal, healthy patient     Mouth opening:  3 or more finger widths   Thyromental distance:  4 finger widths   Mallampati score:  I - soft palate, uvula, fauces, pillars visible   Neck mobility: normal     Pre-sedation assessments completed and reviewed: airway patency, cardiovascular function, hydration status, mental status, nausea/vomiting, pain level, respiratory function and temperature   A pre-sedation assessment was completed prior to the start of the procedure Immediate pre-procedure details:    Reassessment: Patient reassessed immediately prior to procedure     Reviewed: vital signs, relevant labs/tests and NPO status     Verified: bag valve mask available, emergency equipment available, intubation equipment available,  IV patency confirmed, oxygen available and suction available   Procedure details (see MAR for exact dosages):    Preoxygenation:  Nasal cannula   Sedation:  Propofol    Intended level of sedation: deep   Intra-procedure monitoring:  Blood pressure monitoring, cardiac monitor, continuous pulse oximetry, frequent LOC assessments, frequent vital sign checks and continuous capnometry   Intra-procedure events: none     Total Provider sedation time (minutes):  15 Post-procedure details:   A post-sedation assessment was completed following the completion of the procedure.   Attendance: Constant attendance by certified staff until patient recovered     Recovery: Patient returned to pre-procedure baseline     Post-sedation assessments completed and reviewed: airway patency, cardiovascular function, hydration status, mental status, nausea/vomiting, pain level, respiratory function and temperature     Patient is stable for discharge or admission: yes     Procedure completion:  Tolerated well, no immediate complications .Reduction of fracture  Date/Time: 08/16/2024 12:24 AM  Performed by: Doretha Folks, MD Authorized by: Doretha Folks, MD  Consent: Written consent obtained Risks and benefits: risks, benefits and alternatives were discussed Consent given by: patient Imaging studies: imaging studies available Patient identity confirmed: verbally with patient Time out: Immediately prior to procedure a time out was called to verify the correct patient, procedure, equipment, support staff and site/side marked as required. Local anesthesia used: no  Anesthesia: Local anesthesia used: no  Sedation: Patient sedated: yes Sedation type: moderate (conscious) sedation Sedatives: propofol  Analgesia: hydromorphone   Patient tolerance: patient tolerated the procedure well with no immediate complications Comments: Fracture ankle dislocation was reduced with a posterior and stirrup splint  applied  Doretha Folks, MD 08/16/24 [phone]

## 2024-08-18 DIAGNOSIS — M25572 Pain in left ankle and joints of left foot: Secondary | ICD-10-CM | POA: Diagnosis not present

## 2024-08-18 DIAGNOSIS — S82842A Displaced bimalleolar fracture of left lower leg, initial encounter for closed fracture: Secondary | ICD-10-CM | POA: Diagnosis not present

## 2024-08-20 ENCOUNTER — Other Ambulatory Visit: Payer: Self-pay | Admitting: Orthopaedic Surgery

## 2024-08-24 ENCOUNTER — Other Ambulatory Visit: Payer: Self-pay

## 2024-08-24 ENCOUNTER — Encounter (HOSPITAL_COMMUNITY): Payer: Self-pay | Admitting: Orthopaedic Surgery

## 2024-08-24 DIAGNOSIS — M25572 Pain in left ankle and joints of left foot: Secondary | ICD-10-CM | POA: Diagnosis not present

## 2024-08-24 NOTE — Pre-Procedure Instructions (Incomplete)
 SDW CALL  Patient was given pre-op instructions over the phone. The opportunity was given for the patient to ask questions. No further questions asked. Patient verbalized understanding of instructions given.   PCP -  Cardiologist -   PPM/ICD -  Device Orders -  Rep Notified -   Chest x-ray -  EKG -  Stress Test -  ECHO -  Cardiac Cath -   Sleep Study -  CPAP -   Fasting Blood Sugar -  Checks Blood Sugar _____ times a day  Blood Thinner Instructions: Aspirin  Instructions:  ERAS Protcol - PRE-SURGERY Ensure or G2-   COVID TEST-    Anesthesia review:   Patient denies shortness of breath, fever, cough and chest pain over the phone call    Surgical Instructions    Your procedure is scheduled on August 26, 2024  Report to [facility] Main Entrance A at 9:00 A.M., then check in with the Admitting office.  Call this number if you have problems the morning of surgery:  [phone]    Remember:  Do not eat after midnight the night before your surgery  You may drink clear liquids until 0830AM the morning of your surgery.   Clear liquids allowed are: Water, Non-Citrus Juices (without pulp), Carbonated Beverages, Clear Tea, Black Coffee ONLY (NO MILK, CREAM OR POWDERED CREAMER of any kind, NO Honey), and Gatorade   Take these medicines the morning of surgery with A SIP OF WATER:  Amlodipine , doxycycline  Percocet if needed   As of today, STOP taking any Aspirin  (unless otherwise instructed by your surgeon) Aleve , Naproxen , Ibuprofen , Motrin , Advil , Goody's, BC's, all herbal medications, fish oil, and all vitamins.  [facility] is not responsible for any belongings or valuables. .   Do NOT Smoke (Tobacco/Vaping)  24 hours prior to your procedure  If you use a CPAP at night, you may bring your mask for your overnight stay.   Contacts, glasses, hearing aids, dentures or partials may not be worn into surgery, please bring cases for these belongings   Patients  discharged the day of surgery will not be allowed to drive home, and someone needs to stay with them for 24 hours.   SURGICAL WAITING ROOM VISITATION You may have 1 visitor in the pre-op area at a time determined by the pre-op nurse.  Patients having surgery or a procedure in a hospital may have two support people in the waiting room. Children under the age of 3 must have an adult with them who is not the patient.     Day of Surgery:  Take a shower the day of or night before with antibacterial soap. Wear Clean/Comfortable clothing the morning of surgery Do not apply any deodorants/lotions.   Do not wear jewelry or makeup Do not wear lotions, powders, perfumes/colognes, or deodorant. Do not shave 48 hours prior to surgery.  Men may shave face and neck. Do not bring valuables to the hospital. Do not wear nail polish, gel polish, artificial nails, or any other type of covering on natural nails (fingers and toes) If you have artificial nails or gel coating that need to be removed by a nail salon, please have this removed prior to surgery. Artificial nails or gel coating may interfere with anesthesia's ability to adequately monitor your vital signs. Remember to brush your teeth WITH YOUR REGULAR TOOTHPASTE.

## 2024-08-24 NOTE — Progress Notes (Signed)
 SDW call  Patient was given pre-op instructions over the phone. Patient verbalized understanding of instructions provided.     PCP - denies Cardiologist -  Pulmonary:    PPM/ICD - denies Device Orders - na Rep Notified - na   Chest x-ray - na EKG -  DOS, 08/26/2024 Stress Test - ECHO -  Cardiac Cath -   Sleep Study/sleep apnea/CPAP: denies  Type II diabetes. Takes no medication for diabetes and does not check his blood sugar. A1c 6.0 03/01/2021 Fasting Blood sugar range: na How often check sugars: na   Blood Thinner Instructions: denies Aspirin  Instructions:denies   ERAS Protcol - Clears until 0430   Anesthesia review: No   Patient denies shortness of breath, fever, cough and chest pain over the phone call  Your procedure is scheduled on Thursday August 25, 2024  Report to [facility] Main Entrance A at  0900 A.M., then check in with the Admitting office.  Call this number if you have problems the morning of surgery:  [phone]   If you have any questions prior to your surgery date call [phone]: Open Monday-Friday 8am-4pm If you experience any cold or flu symptoms such as cough, fever, chills, shortness of breath, etc. between now and your scheduled surgery, please notify us  at the above number    Remember:  Do not eat after midnight the night before your surgery  You may drink clear liquids until  0830   the morning of your surgery.   Clear liquids allowed are: Water, Non-Citrus Juices (without pulp), Carbonated Beverages, Clear Tea, Black Coffee ONLY (NO MILK, CREAM OR POWDERED CREAMER of any kind), and Gatorade   Take these medicines if needed the morning of surgery with A SIP OF WATER:  Percocet  As of today, STOP taking any Aspirin  (unless otherwise instructed by your surgeon) Aleve , Naproxen , Ibuprofen , Motrin , Advil , Goody's, BC's, all herbal medications, fish oil, and all vitamins.

## 2024-08-26 ENCOUNTER — Ambulatory Visit (HOSPITAL_COMMUNITY)

## 2024-08-26 ENCOUNTER — Other Ambulatory Visit: Payer: Self-pay

## 2024-08-26 ENCOUNTER — Telehealth (HOSPITAL_COMMUNITY): Payer: Self-pay | Admitting: Pharmacy Technician

## 2024-08-26 ENCOUNTER — Other Ambulatory Visit (HOSPITAL_COMMUNITY): Payer: Self-pay

## 2024-08-26 ENCOUNTER — Encounter (HOSPITAL_COMMUNITY): Payer: Self-pay | Admitting: Orthopaedic Surgery

## 2024-08-26 ENCOUNTER — Encounter (HOSPITAL_COMMUNITY)
Admission: RE | Disposition: A | Discharge status: Home / Self Care | Payer: Self-pay | Source: Home / Self Care | Attending: Orthopaedic Surgery

## 2024-08-26 ENCOUNTER — Ambulatory Visit (HOSPITAL_COMMUNITY)
Admission: RE | Admit: 2024-08-26 | Discharge: 2024-08-26 | Disposition: A | Discharge status: Home / Self Care | Attending: Orthopaedic Surgery | Admitting: Orthopaedic Surgery

## 2024-08-26 DIAGNOSIS — I10 Essential (primary) hypertension: Secondary | ICD-10-CM | POA: Diagnosis not present

## 2024-08-26 DIAGNOSIS — S93422A Sprain of deltoid ligament of left ankle, initial encounter: Secondary | ICD-10-CM | POA: Diagnosis not present

## 2024-08-26 DIAGNOSIS — E119 Type 2 diabetes mellitus without complications: Secondary | ICD-10-CM | POA: Insufficient documentation

## 2024-08-26 DIAGNOSIS — S9305XA Dislocation of left ankle joint, initial encounter: Secondary | ICD-10-CM | POA: Diagnosis not present

## 2024-08-26 DIAGNOSIS — G8918 Other acute postprocedural pain: Secondary | ICD-10-CM | POA: Diagnosis not present

## 2024-08-26 DIAGNOSIS — S82832A Other fracture of upper and lower end of left fibula, initial encounter for closed fracture: Secondary | ICD-10-CM | POA: Diagnosis not present

## 2024-08-26 DIAGNOSIS — W19XXXA Unspecified fall, initial encounter: Secondary | ICD-10-CM | POA: Insufficient documentation

## 2024-08-26 DIAGNOSIS — S93432A Sprain of tibiofibular ligament of left ankle, initial encounter: Secondary | ICD-10-CM | POA: Diagnosis not present

## 2024-08-26 DIAGNOSIS — Z87891 Personal history of nicotine dependence: Secondary | ICD-10-CM | POA: Insufficient documentation

## 2024-08-26 DIAGNOSIS — S82402A Unspecified fracture of shaft of left fibula, initial encounter for closed fracture: Secondary | ICD-10-CM

## 2024-08-26 HISTORY — PX: SYNDESMOSIS REPAIR: SHX5182

## 2024-08-26 HISTORY — PX: ORIF FIBULA FRACTURE: SHX5114

## 2024-08-26 HISTORY — PX: ARTHROSCOPY, ANKLE WITH DEBRIDEMENT: SHX7318

## 2024-08-26 LAB — BASIC METABOLIC PANEL WITH GFR
Anion gap: 12 (ref 5–15)
BUN: 12 mg/dL (ref 6–20)
CO2: 25 mmol/L (ref 22–32)
Calcium: 9 mg/dL (ref 8.9–10.3)
Chloride: 105 mmol/L (ref 98–111)
Creatinine, Ser: 1.42 mg/dL — ABNORMAL HIGH (ref 0.61–1.24)
GFR, Estimated: 59 mL/min — ABNORMAL LOW (ref >60)
Glucose, Bld: 114 mg/dL — ABNORMAL HIGH (ref 70–99)
Potassium: 3.4 mmol/L — ABNORMAL LOW (ref 3.5–5.1)
Sodium: 142 mmol/L (ref 135–145)

## 2024-08-26 LAB — CBC
HCT: 39.1 % (ref 39.0–52.0)
Hemoglobin: 13.3 g/dL (ref 13.0–17.0)
MCH: 30.1 pg (ref 26.0–34.0)
MCHC: 34 g/dL (ref 30.0–36.0)
MCV: 88.5 fL (ref 80.0–100.0)
Platelets: 291 K/uL (ref 150–400)
RBC: 4.42 MIL/uL (ref 4.22–5.81)
RDW: 11.6 % (ref 11.5–15.5)
WBC: 7.6 K/uL (ref 4.0–10.5)
nRBC: 0 % (ref 0.0–0.2)

## 2024-08-26 LAB — GLUCOSE, CAPILLARY
Glucose-Capillary: 105 mg/dL — ABNORMAL HIGH (ref 70–99)
Glucose-Capillary: 106 mg/dL — ABNORMAL HIGH (ref 70–99)
Glucose-Capillary: 118 mg/dL — ABNORMAL HIGH (ref 70–99)

## 2024-08-26 SURGERY — ARTHROSCOPY, ANKLE WITH DEBRIDEMENT
Anesthesia: Monitor Anesthesia Care | Site: Ankle | Laterality: Left

## 2024-08-26 MED ORDER — OXYCODONE HCL 5 MG PO TABS
5.0000 mg | ORAL_TABLET | ORAL | 0 refills | Status: AC | PRN
  Filled 2024-08-26: qty 30, 5d supply, fill #0

## 2024-08-26 MED ORDER — PROPOFOL 10 MG/ML IV BOLUS
INTRAVENOUS | Status: DC | PRN
  Administered 2024-08-26: 200 mg via INTRAVENOUS

## 2024-08-26 MED ORDER — FENTANYL CITRATE (PF) 100 MCG/2ML IJ SOLN
INTRAMUSCULAR | Status: AC
  Administered 2024-08-26: 50 ug via INTRAVENOUS
  Filled 2024-08-26: qty 2

## 2024-08-26 MED ORDER — MIDAZOLAM HCL 2 MG/2ML IJ SOLN
INTRAMUSCULAR | Status: AC
  Administered 2024-08-26: 1 mg via INTRAVENOUS
  Filled 2024-08-26: qty 2

## 2024-08-26 MED ORDER — POVIDONE-IODINE 10 % EX SWAB
2.0000 | Freq: Once | CUTANEOUS | Status: AC
Start: 2024-08-26 — End: 2024-08-26
  Administered 2024-08-26: 2 via TOPICAL

## 2024-08-26 MED ORDER — ACETAMINOPHEN 10 MG/ML IV SOLN: 1000.0000 mg | Freq: Once | INTRAVENOUS | Status: DC | PRN

## 2024-08-26 MED ORDER — HYDRALAZINE HCL 20 MG/ML IJ SOLN
INTRAMUSCULAR | Status: DC | PRN
Start: 2024-08-26 — End: 2024-08-26
  Administered 2024-08-26 (×2): 5 mg via INTRAVENOUS

## 2024-08-26 MED ORDER — FENTANYL CITRATE (PF) 250 MCG/5ML IJ SOLN
INTRAMUSCULAR | Status: DC | PRN
  Administered 2024-08-26 (×2): 50 ug via INTRAVENOUS

## 2024-08-26 MED ORDER — 0.9 % SODIUM CHLORIDE (POUR BTL) OPTIME
TOPICAL | Status: DC | PRN
  Administered 2024-08-26: 1000 mL

## 2024-08-26 MED ORDER — OXYCODONE HCL 5 MG PO TABS: 5.0000 mg | ORAL_TABLET | Freq: Once | ORAL | Status: DC | PRN

## 2024-08-26 MED ORDER — DROPERIDOL 2.5 MG/ML IJ SOLN: 0.6250 mg | Freq: Once | INTRAMUSCULAR | Status: DC | PRN

## 2024-08-26 MED ORDER — ONDANSETRON HCL 4 MG/2ML IJ SOLN
INTRAMUSCULAR | Status: DC | PRN
  Administered 2024-08-26: 4 mg via INTRAVENOUS

## 2024-08-26 MED ORDER — ONDANSETRON HCL 4 MG/2ML IJ SOLN
INTRAMUSCULAR | Status: AC
  Filled 2024-08-26: qty 2

## 2024-08-26 MED ORDER — POVIDONE-IODINE 10 % EX SWAB: Freq: Once | CUTANEOUS | Status: DC

## 2024-08-26 MED ORDER — DEXAMETHASONE SODIUM PHOSPHATE 10 MG/ML IJ SOLN
INTRAMUSCULAR | Status: DC | PRN
  Administered 2024-08-26: 10 mg via INTRAVENOUS

## 2024-08-26 MED ORDER — BUPIVACAINE HCL (PF) 0.5 % IJ SOLN
INTRAMUSCULAR | Status: DC | PRN
Start: 2024-08-26 — End: 2024-08-26
  Administered 2024-08-26: 20 mL
  Administered 2024-08-26: 25 mL

## 2024-08-26 MED ORDER — ORAL CARE MOUTH RINSE: 15.0000 mL | Freq: Once | OROMUCOSAL | Status: AC

## 2024-08-26 MED ORDER — LACTATED RINGERS IV SOLN: INTRAVENOUS | Status: DC

## 2024-08-26 MED ORDER — FENTANYL CITRATE (PF) 100 MCG/2ML IJ SOLN: 25.0000 ug | INTRAMUSCULAR | Status: DC | PRN

## 2024-08-26 MED ORDER — FENTANYL CITRATE (PF) 100 MCG/2ML IJ SOLN
INTRAMUSCULAR | Status: AC
  Filled 2024-08-26: qty 2

## 2024-08-26 MED ORDER — PROPOFOL 10 MG/ML IV BOLUS
INTRAVENOUS | Status: AC
  Filled 2024-08-26: qty 20

## 2024-08-26 MED ORDER — CHLORHEXIDINE GLUCONATE 0.12 % MT SOLN
15.0000 mL | Freq: Once | OROMUCOSAL | Status: AC
  Administered 2024-08-26: 15 mL via OROMUCOSAL
  Filled 2024-08-26: qty 15

## 2024-08-26 MED ORDER — OXYCODONE HCL 5 MG/5ML PO SOLN: 5.0000 mg | Freq: Once | ORAL | Status: DC | PRN

## 2024-08-26 MED ORDER — EPHEDRINE 5 MG/ML INJ
INTRAVENOUS | Status: AC
  Filled 2024-08-26: qty 5

## 2024-08-26 MED ORDER — EPHEDRINE SULFATE-NACL 50-0.9 MG/10ML-% IV SOSY
PREFILLED_SYRINGE | INTRAVENOUS | Status: DC | PRN
  Administered 2024-08-26: 10 mg via INTRAVENOUS
  Administered 2024-08-26: 15 mg via INTRAVENOUS

## 2024-08-26 MED ORDER — FENTANYL CITRATE (PF) 100 MCG/2ML IJ SOLN: 50.0000 ug | Freq: Once | INTRAMUSCULAR | Status: AC

## 2024-08-26 MED ORDER — CEFAZOLIN SODIUM-DEXTROSE 2-4 GM/100ML-% IV SOLN
2.0000 g | INTRAVENOUS | Status: AC
  Administered 2024-08-26: 2 g via INTRAVENOUS
  Filled 2024-08-26: qty 100

## 2024-08-26 MED ORDER — ASPIRIN 325 MG PO TABS
ORAL_TABLET | ORAL | 0 refills | Status: AC
  Filled 2024-08-26: qty 30, 30d supply, fill #0

## 2024-08-26 MED ORDER — HYDRALAZINE HCL 20 MG/ML IJ SOLN
INTRAMUSCULAR | Status: AC
  Filled 2024-08-26: qty 1

## 2024-08-26 MED ORDER — MIDAZOLAM HCL 2 MG/2ML IJ SOLN: 1.0000 mg | Freq: Once | INTRAMUSCULAR | Status: AC

## 2024-08-26 SURGICAL SUPPLY — 78 items
BAG COUNTER SPONGE SURGICOUNT (BAG) ×1 IMPLANT
BANDAGE ESMARK 6X9 LF (GAUZE/BANDAGES/DRESSINGS) ×1 IMPLANT
BIT DRILL 2 CANN GRADUATED (BIT) IMPLANT
BIT DRILL 2.5 CANN STRL (BIT) IMPLANT
BIT DRILL 3 CANN ENDOSCOPIC (BIT) IMPLANT
BLADE CLIPPER SURG (BLADE) IMPLANT
BLADE SURG 11 STRL SS (BLADE) IMPLANT
BLADE SURG 15 STRL LF DISP TIS (BLADE) ×1 IMPLANT
BNDG ELASTIC 4X5.8 VLCR STR LF (GAUZE/BANDAGES/DRESSINGS) ×1 IMPLANT
BNDG ELASTIC 6INX 5YD STR LF (GAUZE/BANDAGES/DRESSINGS) ×1 IMPLANT
BNDG ELASTIC 6X10 VLCR STRL LF (GAUZE/BANDAGES/DRESSINGS) ×1 IMPLANT
BNDG GAUZE DERMACEA FLUFF 4 (GAUZE/BANDAGES/DRESSINGS) ×1 IMPLANT
BRUSH SCRUB EZ PLAIN DRY (MISCELLANEOUS) ×2 IMPLANT
CANISTER SUCTION 3000ML PPV (SUCTIONS) ×1 IMPLANT
CHLORAPREP W/TINT 26 (MISCELLANEOUS) ×2 IMPLANT
COVER SURGICAL LIGHT HANDLE (MISCELLANEOUS) ×1 IMPLANT
CUFF TRNQT CYL 34X4.125X (TOURNIQUET CUFF) ×1 IMPLANT
DISSECTOR 3.8MM X 13CM (MISCELLANEOUS) IMPLANT
DRAPE ARTHROSCOPY W/POUCH 114 (DRAPES) ×1 IMPLANT
DRAPE C-ARM 42X72 X-RAY (DRAPES) ×1 IMPLANT
DRAPE C-ARMOR (DRAPES) ×1 IMPLANT
DRAPE OEC MINIVIEW 54X84 (DRAPES) ×1 IMPLANT
DRAPE SURG ORHT 6 SPLT 77X108 (DRAPES) ×2 IMPLANT
DRAPE U-SHAPE 47X51 STRL (DRAPES) ×1 IMPLANT
DRSG XEROFORM 1X8 (GAUZE/BANDAGES/DRESSINGS) ×2 IMPLANT
ELECTRODE REM PT RTRN 9FT ADLT (ELECTROSURGICAL) ×1 IMPLANT
EXCALIBUR 3.8MM X 13CM (MISCELLANEOUS) ×1 IMPLANT
GAUZE PAD ABD 8X10 STRL (GAUZE/BANDAGES/DRESSINGS) ×2 IMPLANT
GAUZE SPONGE 4X4 12PLY STRL (GAUZE/BANDAGES/DRESSINGS) ×1 IMPLANT
GAUZE SPONGE 4X4 12PLY STRL LF (GAUZE/BANDAGES/DRESSINGS) ×1 IMPLANT
GAUZE XEROFORM 1X8 LF (GAUZE/BANDAGES/DRESSINGS) ×1 IMPLANT
GLOVE BIOGEL M STRL SZ7.5 (GLOVE) ×4 IMPLANT
GLOVE BIOGEL PI IND STRL 8 (GLOVE) ×1 IMPLANT
GOWN STRL REUS W/ TWL LRG LVL3 (GOWN DISPOSABLE) ×2 IMPLANT
GOWN STRL REUS W/ TWL XL LVL3 (GOWN DISPOSABLE) ×1 IMPLANT
IMMOBILIZER KNEE 22 UNIV (SOFTGOODS) ×1 IMPLANT
KIT BASIN OR (CUSTOM PROCEDURE TRAY) ×1 IMPLANT
KIT TURNOVER KIT B (KITS) ×1 IMPLANT
NDL 18GX1X1/2 (RX/OR ONLY) (NEEDLE) ×1 IMPLANT
NDL SUT 6 .5 CRC .975X.05 MAYO (NEEDLE) ×1 IMPLANT
NEEDLE 18GX1X1/2 (RX/OR ONLY) (NEEDLE) ×1 IMPLANT
NS IRRIG 1000ML POUR BTL (IV SOLUTION) ×1 IMPLANT
PACK ORTHO EXTREMITY (CUSTOM PROCEDURE TRAY) ×1 IMPLANT
PACK TOTAL JOINT (CUSTOM PROCEDURE TRAY) ×1 IMPLANT
PAD ARMBOARD POSITIONER FOAM (MISCELLANEOUS) ×2 IMPLANT
PAD CAST 4YDX4 CTTN HI CHSV (CAST SUPPLIES) ×1 IMPLANT
PADDING CAST COTTON 6X4 STRL (CAST SUPPLIES) ×1 IMPLANT
PENCIL BUTTON HOLSTER BLD 10FT (ELECTRODE) IMPLANT
PLATE 8 HOLE STRAIGHT LOCKING (Plate) IMPLANT
SCREW CORT 3.5X18 THRD (Screw) IMPLANT
SCREW CORT LP ANKLE 3.5X55 (Screw) IMPLANT
SCREW CORTICAL 3MMX18MM (Screw) IMPLANT
SCREW LO-PRO TI 3.5X16MM (Screw) IMPLANT
SCREW LP TI 3.5X14MM (Screw) IMPLANT
SPIKE FLUID TRANSFER (MISCELLANEOUS) IMPLANT
SPONGE T-LAP 18X18 ~~LOC~~+RFID (SPONGE) IMPLANT
STAPLER SKIN PROX 35W (STAPLE) ×1 IMPLANT
STRAP ANKLE DISTRACTOR (MISCELLANEOUS) IMPLANT
STRAP ANKLE FOOT DISTRACTOR (ORTHOPEDIC SUPPLIES) ×1 IMPLANT
STRIP CLOSURE SKIN 1/2X4 (GAUZE/BANDAGES/DRESSINGS) IMPLANT
SUCTION TUBE FRAZIER 10FR DISP (SUCTIONS) ×1 IMPLANT
SUT ETHILON 2 0 FS 18 (SUTURE) ×1 IMPLANT
SUT ETHILON 3 0 PS 1 (SUTURE) ×1 IMPLANT
SUT MNCRL AB 3-0 PS2 18 (SUTURE) ×1 IMPLANT
SUT PDS AB 2-0 CT2 27 (SUTURE) ×1 IMPLANT
SUT VIC AB 0 CT1 27XBRD ANBCTR (SUTURE) IMPLANT
SUT VIC AB 1 CT1 18XCR BRD 8 (SUTURE) IMPLANT
SUT VIC AB 1 CT1 27XBRD ANBCTR (SUTURE) ×1 IMPLANT
SUT VIC AB 2-0 CT1 TAPERPNT 27 (SUTURE) ×2 IMPLANT
SUT VIC AB 3-0 FS2 27 (SUTURE) IMPLANT
SUTURE FIBERWR #2 38 T-5 BLUE (SUTURE) IMPLANT
SYR 20ML LL LF (SYRINGE) ×1 IMPLANT
TOWEL GREEN STERILE (TOWEL DISPOSABLE) ×2 IMPLANT
TRAY FOLEY MTR SLVR 16FR STAT (SET/KITS/TRAYS/PACK) IMPLANT
TUBE CONNECTING 20X1/4 (TUBING) ×1 IMPLANT
TUBING ARTHROSCOPY IRRIG 16FT (MISCELLANEOUS) ×1 IMPLANT
UNDERPAD 30X36 HEAVY ABSORB (UNDERPADS AND DIAPERS) ×1 IMPLANT
WATER STERILE IRR 1000ML POUR (IV SOLUTION) ×2 IMPLANT

## 2024-08-26 NOTE — Anesthesia Procedure Notes (Signed)
 Procedure Name: LMA Insertion Date/Time: 08/26/2024 12:08 PM  Performed by: Kearney Rosina SAILOR, RNPre-anesthesia Checklist: Patient identified, Patient being monitored, Timeout performed, Emergency Drugs available and Suction available Patient Re-evaluated:Patient Re-evaluated prior to induction Oxygen Delivery Method: Circle system utilized Preoxygenation: Pre-oxygenation with 100% oxygen Induction Type: IV induction Ventilation: Mask ventilation without difficulty LMA: LMA inserted LMA Size: 4.5 Tube type: Oral Number of attempts: 1 Placement Confirmation: positive ETCO2 and breath sounds checked- equal and bilateral Tube secured with: Tape Dental Injury: Teeth and Oropharynx as per pre-operative assessment  Comments: atraumatic
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
## 2024-08-26 NOTE — Discharge Instructions (Signed)
 DR. ELSA [facility] POST-OP INSTRUCTIONS   Pain Management The numbing medicine and your leg will last around 18 hours, take a dose of your pain medicine as soon as you feel it wearing off to avoid rebound pain. Keep your foot elevated above heart level.  Make sure that your heel hangs free ('floats'). Take all prescribed medication as directed. If taking narcotic pain medication you may want to use an over-the-counter stool softener to avoid constipation. You may take over-the-counter NSAIDs (ibuprofen, naproxen, etc.) as well as over-the-counter acetaminophen  as directed on the packaging as a supplement for your pain and may also use it to wean away from the prescription medication.  Activity Non-weightbearing Keep splint intact  First Postoperative Visit Your first postop visit will be at least 2 weeks after surgery.  This should be scheduled when you schedule surgery. If you do not have a postoperative visit scheduled please call [phone] to schedule an appointment. At the appointment your incision will be evaluated for suture removal, x-rays will be obtained if necessary.  General Instructions Swelling is very common after foot and ankle surgery.  It often takes 3 months for the foot and ankle to begin to feel comfortable.  Some amount of swelling will persist for 6-12 months. DO NOT change the dressing.  If there is a problem with the dressing (too tight, loose, gets wet, etc.) please contact Dr. Isiah office. DO NOT get the dressing wet.  For showers you can use an over-the-counter cast cover or wrap a washcloth around the top of your dressing and then cover it with a plastic bag and tape it to your leg. DO NOT soak the incision (no tubs, pools, bath, etc.) until you have approval from Dr. ELSA.  Contact Dr. Nadara office or go to Emergency Room if: Temperature above 101 F. Increasing pain that is unresponsive to pain medication or elevation Excessive redness or  swelling in your foot Dressing problems - excessive bloody drainage, looseness or tightness, or if dressing gets wet Develop pain, swelling, warmth, or discoloration of your calf

## 2024-08-26 NOTE — Transfer of Care (Signed)
 Immediate Anesthesia Transfer of Care Note  Patient: Hunter Fisher  Procedure(s) Performed: ARTHROSCOPY, ANKLE WITH DEBRIDEMENT (Left: Ankle) OPEN REDUCTION INTERNAL FIXATION (ORIF) FIBULA FRACTURE (Left: Ankle) REPAIR, SYNDESMOSIS, ANKLE (Left: Ankle)  Patient Location: PACU  Anesthesia Type:General  Level of Consciousness: awake and patient cooperative  Airway & Oxygen Therapy: Patient Spontanous Breathing and Patient connected to face mask oxygen  Post-op Assessment: Report given to RN, Post -op Vital signs reviewed and stable, and Patient moving all extremities X 4  Post vital signs: Reviewed and stable  Last Vitals:  Vitals Value Taken Time  BP 186/98 08/26/24 13:40  Temp 36.7 C 08/26/24 13:40  Pulse 84 08/26/24 13:45  Resp 18 08/26/24 13:45  SpO2 99 % 08/26/24 13:45  Vitals shown include unfiled device data.  Last Pain:  Vitals:   08/26/24 0955  TempSrc:   PainSc: 0-No pain         Complications: There were no known notable events for this encounter.

## 2024-08-26 NOTE — Anesthesia Procedure Notes (Signed)
 Anesthesia Regional Block: Adductor canal block   Pre-Anesthetic Checklist: , timeout performed,  Correct Patient, Correct Site, Correct Laterality,  Correct Procedure, Correct Position, site marked,  Risks and benefits discussed,  Surgical consent,  Pre-op evaluation,  At surgeon's request and post-op pain management  Laterality: Left  Prep: chloraprep       Needles:  Injection technique: Single-shot  Needle Type: Echogenic Stimulator Needle     Needle Length: 9cm  Needle Gauge: 21     Additional Needles:   Procedures:,,,, ultrasound used (permanent image in chart),,    Narrative:  Start time: 08/26/2024 10:06 AM End time: 08/26/2024 10:10 AM Injection made incrementally with aspirations every 5 mL.  Performed by: Personally  Anesthesiologist: Erma Thom SAUNDERS, MD  Additional Notes: Discussed risks and benefits of the nerve block in detail, including but not limited vascular injury, permanent nerve damage and infection.   Patient tolerated the procedure well. Local anesthetic introduced in an incremental fashion under minimal resistance after negative aspirations. No paresthesias were elicited. After completion of the procedure, no acute issues were identified and patient continued to be monitored by RN.

## 2024-08-26 NOTE — Telephone Encounter (Signed)
 Pharmacy Patient Advocate Encounter   Received notification from Inpatient Request that prior authorization for oxyCODONE  HCl 5MG  tablets  is required/requested.   Insurance verification completed.   The patient is insured through [employer] [facility] [employer] .   Per test claim: PA required; PA submitted to above mentioned insurance via Latent Key/confirmation #/EOC [facility] Status is pending
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
## 2024-08-26 NOTE — Anesthesia Procedure Notes (Signed)
 Anesthesia Regional Block: Popliteal block   Pre-Anesthetic Checklist: , timeout performed,  Correct Patient, Correct Site, Correct Laterality,  Correct Procedure, Correct Position, site marked,  Risks and benefits discussed,  Surgical consent,  Pre-op evaluation,  At surgeon's request and post-op pain management  Laterality: Left  Prep: chloraprep       Needles:  Injection technique: Single-shot  Needle Type: Echogenic Stimulator Needle     Needle Length: 9cm  Needle Gauge: 21     Additional Needles:   Procedures:,,,, ultrasound used (permanent image in chart),,    Narrative:  Start time: 08/26/2024 10:00 AM End time: 08/26/2024 10:06 AM Injection made incrementally with aspirations every 5 mL.  Performed by: Personally  Anesthesiologist: Erma Thom SAUNDERS, MD  Additional Notes: Discussed risks and benefits of the nerve block in detail, including but not limited vascular injury, permanent nerve damage and infection.   Patient tolerated the procedure well. Local anesthetic introduced in an incremental fashion under minimal resistance after negative aspirations. No paresthesias were elicited. After completion of the procedure, no acute issues were identified and patient continued to be monitored by RN.

## 2024-08-27 ENCOUNTER — Encounter (HOSPITAL_COMMUNITY): Payer: Self-pay | Admitting: Orthopaedic Surgery

## 2024-08-27 NOTE — Telephone Encounter (Signed)
 Pharmacy Patient Advocate Encounter  Received notification from [facility] that Prior Authorization for oxyCODONE  HCl 5MG  tablets  has been APPROVED from 08/26/2024 to 09/25/2024   PA #/Case ID/Reference #: [id number]
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
## 2024-08-27 NOTE — Anesthesia Postprocedure Evaluation (Signed)
 Anesthesia Post Note  Patient: Hunter Fisher  Procedure(s) Performed: ARTHROSCOPY, ANKLE WITH DEBRIDEMENT (Left: Ankle) OPEN REDUCTION INTERNAL FIXATION (ORIF) FIBULA FRACTURE (Left: Ankle) REPAIR, SYNDESMOSIS, ANKLE (Left: Ankle)     Patient location during evaluation: PACU Anesthesia Type: Regional and General Level of consciousness: awake and alert Pain management: pain level controlled Vital Signs Assessment: post-procedure vital signs reviewed and stable Respiratory status: spontaneous breathing, nonlabored ventilation, respiratory function stable and patient connected to nasal cannula oxygen Cardiovascular status: blood pressure returned to baseline and stable Postop Assessment: no apparent nausea or vomiting Anesthetic complications: no   There were no known notable events for this encounter.  Last Vitals:  Vitals:   08/26/24 1400 08/26/24 1415  BP: (!) 176/93 (!) 171/97  Pulse: 83 76  Resp: 19 17  Temp:  36.8 C  SpO2: 98% 99%    Last Pain:  Vitals:   08/26/24 1400  TempSrc:   PainSc: 0-No pain                 Thom JONELLE Peoples

## 2024-08-31 ENCOUNTER — Other Ambulatory Visit (HOSPITAL_COMMUNITY): Payer: Self-pay

## 2024-09-10 DIAGNOSIS — M25572 Pain in left ankle and joints of left foot: Secondary | ICD-10-CM | POA: Diagnosis not present

## 2024-10-08 DIAGNOSIS — M25572 Pain in left ankle and joints of left foot: Secondary | ICD-10-CM | POA: Diagnosis not present
